# Patient Record
Sex: Female | Born: 1970
Health system: Southern US, Community
[De-identification: ages and names within clinical notes are randomized; demographics above are authoritative.]

## PROBLEM LIST (undated history)

## (undated) DIAGNOSIS — F329 Major depressive disorder, single episode, unspecified: Secondary | ICD-10-CM

## (undated) DIAGNOSIS — F909 Attention-deficit hyperactivity disorder, unspecified type: Secondary | ICD-10-CM

## (undated) DIAGNOSIS — J302 Other seasonal allergic rhinitis: Secondary | ICD-10-CM

## (undated) DIAGNOSIS — F419 Anxiety disorder, unspecified: Secondary | ICD-10-CM

## (undated) DIAGNOSIS — E119 Type 2 diabetes mellitus without complications: Secondary | ICD-10-CM

## (undated) DIAGNOSIS — F32A Depression, unspecified: Secondary | ICD-10-CM

## (undated) DIAGNOSIS — D649 Anemia, unspecified: Secondary | ICD-10-CM

## (undated) HISTORY — DX: Type 2 diabetes mellitus without complications: E11.9

## (undated) HISTORY — PX: TUBAL LIGATION: SHX77

## (undated) HISTORY — PX: WISDOM TOOTH EXTRACTION: SHX21

---

## 1999-12-08 ENCOUNTER — Other Ambulatory Visit: Admission: RE | Admit: 1999-12-08 | Discharge: 1999-12-08 | Payer: Self-pay | Admitting: Family Medicine

## 2000-01-24 ENCOUNTER — Other Ambulatory Visit: Admission: RE | Admit: 2000-01-24 | Discharge: 2000-01-24 | Payer: Self-pay | Admitting: Obstetrics and Gynecology

## 2000-01-24 ENCOUNTER — Encounter (INDEPENDENT_AMBULATORY_CARE_PROVIDER_SITE_OTHER): Payer: Self-pay

## 2000-08-22 ENCOUNTER — Other Ambulatory Visit: Admission: RE | Admit: 2000-08-22 | Discharge: 2000-08-22 | Payer: Self-pay | Admitting: Obstetrics and Gynecology

## 2000-09-02 ENCOUNTER — Inpatient Hospital Stay (HOSPITAL_COMMUNITY): Admission: AD | Admit: 2000-09-02 | Discharge: 2000-09-02 | Payer: Self-pay | Admitting: Obstetrics and Gynecology

## 2001-03-16 ENCOUNTER — Encounter: Payer: Self-pay | Admitting: Emergency Medicine

## 2001-03-16 ENCOUNTER — Emergency Department (HOSPITAL_COMMUNITY): Admission: EM | Admit: 2001-03-16 | Discharge: 2001-03-16 | Payer: Self-pay | Admitting: Emergency Medicine

## 2001-03-17 ENCOUNTER — Ambulatory Visit (HOSPITAL_COMMUNITY): Admission: RE | Admit: 2001-03-17 | Discharge: 2001-03-17 | Payer: Self-pay | Admitting: Obstetrics and Gynecology

## 2001-04-22 ENCOUNTER — Inpatient Hospital Stay (HOSPITAL_COMMUNITY): Admission: AD | Admit: 2001-04-22 | Discharge: 2001-04-22 | Payer: Self-pay | Admitting: Obstetrics and Gynecology

## 2001-04-26 ENCOUNTER — Inpatient Hospital Stay (HOSPITAL_COMMUNITY): Admission: AD | Admit: 2001-04-26 | Discharge: 2001-04-26 | Payer: Self-pay | Admitting: Obstetrics and Gynecology

## 2001-04-30 ENCOUNTER — Ambulatory Visit (HOSPITAL_COMMUNITY): Admission: RE | Admit: 2001-04-30 | Discharge: 2001-04-30 | Payer: Self-pay | Admitting: Obstetrics and Gynecology

## 2001-04-30 ENCOUNTER — Encounter: Payer: Self-pay | Admitting: Obstetrics and Gynecology

## 2001-05-05 ENCOUNTER — Inpatient Hospital Stay (HOSPITAL_COMMUNITY): Admission: AD | Admit: 2001-05-05 | Discharge: 2001-05-07 | Payer: Self-pay | Admitting: Obstetrics and Gynecology

## 2001-06-13 ENCOUNTER — Other Ambulatory Visit: Admission: RE | Admit: 2001-06-13 | Discharge: 2001-06-13 | Payer: Self-pay | Admitting: Obstetrics and Gynecology

## 2002-04-28 ENCOUNTER — Inpatient Hospital Stay (HOSPITAL_COMMUNITY): Admission: AD | Admit: 2002-04-28 | Discharge: 2002-04-30 | Payer: Self-pay | Admitting: Obstetrics and Gynecology

## 2002-04-28 ENCOUNTER — Encounter (INDEPENDENT_AMBULATORY_CARE_PROVIDER_SITE_OTHER): Payer: Self-pay

## 2002-06-08 ENCOUNTER — Other Ambulatory Visit: Admission: RE | Admit: 2002-06-08 | Discharge: 2002-06-08 | Payer: Self-pay | Admitting: Obstetrics and Gynecology

## 2002-08-11 ENCOUNTER — Other Ambulatory Visit: Admission: RE | Admit: 2002-08-11 | Discharge: 2002-08-11 | Payer: Self-pay | Admitting: Obstetrics and Gynecology

## 2003-03-14 ENCOUNTER — Encounter: Payer: Self-pay | Admitting: Emergency Medicine

## 2003-03-14 ENCOUNTER — Inpatient Hospital Stay (HOSPITAL_COMMUNITY): Admission: AD | Admit: 2003-03-14 | Discharge: 2003-03-17 | Payer: Self-pay | Admitting: Emergency Medicine

## 2003-03-15 ENCOUNTER — Encounter: Payer: Self-pay | Admitting: General Surgery

## 2003-03-15 ENCOUNTER — Encounter (INDEPENDENT_AMBULATORY_CARE_PROVIDER_SITE_OTHER): Payer: Self-pay | Admitting: Specialist

## 2003-12-03 ENCOUNTER — Emergency Department (HOSPITAL_COMMUNITY): Admission: EM | Admit: 2003-12-03 | Discharge: 2003-12-03 | Payer: Self-pay | Admitting: Emergency Medicine

## 2003-12-03 ENCOUNTER — Emergency Department (HOSPITAL_COMMUNITY): Admission: EM | Admit: 2003-12-03 | Discharge: 2003-12-03 | Payer: Self-pay

## 2004-03-21 ENCOUNTER — Other Ambulatory Visit: Admission: RE | Admit: 2004-03-21 | Discharge: 2004-03-21 | Payer: Self-pay | Admitting: Obstetrics and Gynecology

## 2004-09-15 ENCOUNTER — Emergency Department (HOSPITAL_COMMUNITY): Admission: EM | Admit: 2004-09-15 | Discharge: 2004-09-16 | Payer: Self-pay | Admitting: Emergency Medicine

## 2005-02-05 ENCOUNTER — Ambulatory Visit: Payer: Self-pay | Admitting: Internal Medicine

## 2005-03-02 ENCOUNTER — Ambulatory Visit: Payer: Self-pay | Admitting: Internal Medicine

## 2005-03-07 ENCOUNTER — Emergency Department (HOSPITAL_COMMUNITY): Admission: EM | Admit: 2005-03-07 | Discharge: 2005-03-07 | Payer: Self-pay | Admitting: Emergency Medicine

## 2005-05-07 ENCOUNTER — Ambulatory Visit: Payer: Self-pay | Admitting: Internal Medicine

## 2005-09-21 ENCOUNTER — Other Ambulatory Visit: Admission: RE | Admit: 2005-09-21 | Discharge: 2005-09-21 | Payer: Self-pay | Admitting: Obstetrics and Gynecology

## 2005-10-02 ENCOUNTER — Ambulatory Visit: Payer: Self-pay | Admitting: Family Medicine

## 2006-01-28 ENCOUNTER — Ambulatory Visit: Payer: Self-pay | Admitting: Family Medicine

## 2006-06-16 ENCOUNTER — Emergency Department (HOSPITAL_COMMUNITY): Admission: EM | Admit: 2006-06-16 | Discharge: 2006-06-16 | Payer: Self-pay | Admitting: Family Medicine

## 2006-06-25 ENCOUNTER — Emergency Department (HOSPITAL_COMMUNITY): Admission: EM | Admit: 2006-06-25 | Discharge: 2006-06-25 | Payer: Self-pay | Admitting: Emergency Medicine

## 2006-08-27 ENCOUNTER — Ambulatory Visit: Payer: Self-pay | Admitting: Family Medicine

## 2006-09-03 ENCOUNTER — Ambulatory Visit: Payer: Self-pay | Admitting: Family Medicine

## 2007-07-04 ENCOUNTER — Ambulatory Visit: Payer: Self-pay | Admitting: Family Medicine

## 2007-07-04 DIAGNOSIS — F17200 Nicotine dependence, unspecified, uncomplicated: Secondary | ICD-10-CM

## 2007-07-04 DIAGNOSIS — J320 Chronic maxillary sinusitis: Secondary | ICD-10-CM

## 2007-10-24 ENCOUNTER — Emergency Department (HOSPITAL_COMMUNITY): Admission: EM | Admit: 2007-10-24 | Discharge: 2007-10-24 | Payer: Self-pay | Admitting: Emergency Medicine

## 2008-01-02 ENCOUNTER — Telehealth: Payer: Self-pay | Admitting: Internal Medicine

## 2008-01-19 ENCOUNTER — Emergency Department (HOSPITAL_COMMUNITY): Admission: EM | Admit: 2008-01-19 | Discharge: 2008-01-19 | Payer: Self-pay | Admitting: Family Medicine

## 2008-08-26 ENCOUNTER — Telehealth: Payer: Self-pay | Admitting: Family Medicine

## 2008-12-11 ENCOUNTER — Emergency Department (HOSPITAL_COMMUNITY): Admission: EM | Admit: 2008-12-11 | Discharge: 2008-12-11 | Payer: Self-pay | Admitting: Family Medicine

## 2009-09-08 ENCOUNTER — Ambulatory Visit: Payer: Self-pay | Admitting: Family Medicine

## 2009-09-08 DIAGNOSIS — F331 Major depressive disorder, recurrent, moderate: Secondary | ICD-10-CM

## 2009-10-14 ENCOUNTER — Telehealth (INDEPENDENT_AMBULATORY_CARE_PROVIDER_SITE_OTHER): Payer: Self-pay | Admitting: Internal Medicine

## 2009-12-19 ENCOUNTER — Emergency Department (HOSPITAL_COMMUNITY): Admission: EM | Admit: 2009-12-19 | Discharge: 2009-12-19 | Payer: Self-pay | Admitting: Internal Medicine

## 2009-12-30 ENCOUNTER — Telehealth: Payer: Self-pay | Admitting: Family Medicine

## 2010-01-04 ENCOUNTER — Ambulatory Visit: Payer: Self-pay | Admitting: Family Medicine

## 2010-01-04 DIAGNOSIS — F411 Generalized anxiety disorder: Secondary | ICD-10-CM | POA: Insufficient documentation

## 2010-01-12 ENCOUNTER — Ambulatory Visit: Payer: Self-pay | Admitting: Professional

## 2010-12-28 NOTE — Assessment & Plan Note (Signed)
Summary: REFILL MED/BILLIE'S PT/CLE   Vital Signs:  Patient profile:   39 year old female Height:      65 inches Weight:      232.50 pounds BMI:     38.83 Temp:     98.2 degrees F oral Pulse rate:   120 / minute Pulse rhythm:   regular BP sitting:   122 / 88  (left arm) Cuff size:   large  Vitals Entered By: Linde Gillis CMA Duncan Dull) (January 04, 2010 9:51 AM) CC: medication refill, Billie patient   History of Present Illness: Depression..started on Wellbutrin 3 months ago. Has been having increas in stress in last 2 weeks. Daughter ran away.  Increase inanxiety in last 2 weeks.  Sad, angry, anxious. Difficulty with insomnia in last few weeks.       Problems Prior to Update: 1)  Depression  (ICD-311) 2)  Tobacco Abuse  (ICD-305.1) 3)  Maxillary Sinusitis  (ICD-473.0)  Current Medications (verified): 1)  Claritin 10 Mg  Tabs (Loratadine) .... Take 1 Tablet By Mouth Once A Day As Needed 2)  Adderall 30 Mg  Tabs (Amphetamine-Dextroamphetamine) .... Take 1 Tablet By Mouth Once A Day 3)  Bupropion Hcl 150 Mg Xr24h-Tab (Bupropion Hcl) .Marland Kitchen.. 1 Tab By Mouth Daily  Allergies (verified): No Known Drug Allergies  Past History:  Past medical, surgical, family and social histories (including risk factors) reviewed, and no changes noted (except as noted below).  Family History: Reviewed history and no changes required.  Social History: Reviewed history from 09/08/2009 and no changes required. Marital Status: Married Children: 4 Occupation: works at [employer] lab  Review of Systems General:  Complains of fatigue. CV:  Denies chest pain or discomfort. Resp:  Denies shortness of breath. Psych:  Complains of suicidal thoughts/plans; no plans of suicide.Marland KitchenMarland Kitchen"If I messed my daughter up..I might mess my other kids up..would they be better off without me"..but she states she would not do it because then they would be alone. Marland Kitchen  Physical Exam  General:  alert, well-developed,  well-nourished, well-hydrated, and overweight-appearing.  has gained 23 lbs since last visit 06/2007 Mouth:  MMM Neck:  no carotid bruit or thyromegaly no cervical or supraclavicular lymphadenopathy  Lungs:  Normal respiratory effort, chest expands symmetrically. Lungs are clear to auscultation, no crackles or wheezes. Heart:  Normal rate and regular rhythm. S1 and S2 normal without gallop, murmur, click, rub or other extra sounds.   Impression & Recommendations:  Problem # 1:  DEPRESSION (ICD-311) Poor control..Change to long acting wellbutrin..may need to also increase further. Call if sleep continues to be issue to consider trazodone. Strongly recommended family counsleing. Gaynelle Adu will be given info to arrange.  Close folloow up in 36month.  Her updated medication list for this problem includes:    Bupropion Hcl 150 Mg Xr24h-tab (Bupropion hcl) .Marland Kitchen... 1 tab by mouth daily  Orders: Psychology Referral (Psychology)  Problem # 2:  ANXIETY STATE, UNSPECIFIED (ICD-300.00)  Her updated medication list for this problem includes:    Bupropion Hcl 150 Mg Xr24h-tab (Bupropion hcl) .Marland Kitchen... 1 tab by mouth daily  Orders: Psychology Referral (Psychology)  Complete Medication List: 1)  Claritin 10 Mg Tabs (Loratadine) .... Take 1 tablet by mouth once a day as needed 2)  Adderall 30 Mg Tabs (Amphetamine-dextroamphetamine) .... Take 1 tablet by mouth once a day 3)  Bupropion Hcl 150 Mg Xr24h-tab (Bupropion hcl) .Marland Kitchen.. 1 tab by mouth daily  Patient Instructions: 1)  Referral Appointment Information 2)  Day/Date:  3)  Time: 4)  Place/MD: 5)  Address: 6)  Phone/Fax: 7)  Patient given appointment information. Information/Orders faxed/mailed.  8)  Please schedule a follow-up appointment in 1 month depression. 9)  Schedule CPX in next few months.  Prescriptions: BUPROPION HCL 150 MG XR24H-TAB (BUPROPION HCL) 1 tab by mouth daily  #90 x 0   Entered and Authorized by:   Kerby Nora MD   Signed by:   Kerby Nora MD on 01/04/2010   Method used:   Electronically to        [facility]* (retail)       [address]       [address]mail[address]       [place], [place]  [address]       Ph: [phone]       Fax: [phone]   RxID:   [id number]   Current Allergies (reviewed today): No known allergies

## 2010-12-28 NOTE — Progress Notes (Signed)
Summary: wellbutrin   Phone Note Refill Request Call back at [phone] Message from:  Patient on December 30, 2009 8:58 AM  Refills Requested: Medication #1:  WELLBUTRIN SR 150 MG XR12H-TAB take 1 each morning. [facility]   Initial call taken by: Melody Comas,  December 30, 2009 8:59 AM  Follow-up for Phone Call        px written on EMR for call in  Follow-up by: Judith Part MD,  December 30, 2009 9:15 AM    Prescriptions: WELLBUTRIN SR 150 MG XR12H-TAB (BUPROPION HCL) take 1 each morning  #30 x 11   Entered by:   Lowella Petties CMA   Authorized by:   Judith Part MD   Signed by:   Lowella Petties CMA on 12/30/2009   Method used:   Electronically to        [facility]* (retail)       [address]       [address] [place] [address]       [place], [place]  [address]       Ph: [phone]       Fax: [phone]   RxID:   [id number] WELLBUTRIN SR 150 MG XR12H-TAB (BUPROPION HCL) take 1 each morning  #30 x 11   Entered and Authorized by:   Judith Part MD   Signed by:   Judith Part MD on 12/30/2009   Method used:   Telephoned to ...         RxID:   [id number]

## 2011-03-27 ENCOUNTER — Ambulatory Visit (INDEPENDENT_AMBULATORY_CARE_PROVIDER_SITE_OTHER): Payer: 59 | Admitting: Family Medicine

## 2011-03-27 ENCOUNTER — Encounter: Payer: Self-pay | Admitting: Family Medicine

## 2011-03-27 VITALS — BP 100/60 | HR 70 | Temp 98.0°F | Ht 65.0 in | Wt 240.1 lb

## 2011-03-27 DIAGNOSIS — J329 Chronic sinusitis, unspecified: Secondary | ICD-10-CM

## 2011-03-27 HISTORY — PX: CHOLECYSTECTOMY: SHX55

## 2011-03-27 MED ORDER — AZITHROMYCIN 250 MG PO TABS: 250.0000 mg | ORAL_TABLET | Freq: Every day | ORAL | Status: DC

## 2011-03-27 MED ORDER — ALBUTEROL SULFATE HFA 108 (90 BASE) MCG/ACT IN AERS: 2.0000 | INHALATION_SPRAY | Freq: Four times a day (QID) | RESPIRATORY_TRACT | Status: DC | PRN

## 2011-03-27 MED ORDER — AZITHROMYCIN 250 MG PO TABS: ORAL_TABLET | ORAL | Status: DC

## 2011-03-27 NOTE — Progress Notes (Signed)
Addended by: Linde Gillis on: 03/27/2011 10:59 AM   Modules accepted: Orders

## 2011-03-27 NOTE — Progress Notes (Signed)
Subjective:     Patricia Bender is a 40 y.o. female who presents for evaluation of sinus pain. Symptoms include: congestion, cough, facial pain, fevers, nasal congestion, sore throat and dry cough, wheezing. Onset of symptoms was 5 days ago. Symptoms have been gradually worsening since that time. Past history is significant for asthma. Patient is a smoker.  The PMH, PSH, Social History, Family History, Medications, and allergies have been reviewed in [facility], and have been updated if relevant.   Review of Systems Pertinent items are noted in HPI.   Objective:    General appearance: alert and fatigued  HEENT:  TMs retracted bilaterally, no lymphadenopathy Resp:  Diffuse exp wheezes, no cracks. CVS:  RRR   Assessment:    Acute bacterial sinusitis.  Acute bronchitis.   Plan:    Nasal saline sprays. Antihistamines per medication orders. Azithromycin per medication orders.

## 2011-04-13 NOTE — Consult Note (Signed)
NAME:  Patricia Bender, Patricia Bender                        ACCOUNT NO.:  [id number]   MEDICAL RECORD NO.:  [id number]                   PATIENT TYPE:  INP   LOCATION:  5740                                 FACILITY:  [facility]   PHYSICIAN:  Petra Kuba, M.D.                 DATE OF BIRTH:  Jun 29, 1971   DATE OF CONSULTATION:  03/15/2003  DATE OF DISCHARGE:                                   CONSULTATION   HISTORY:  The patient seen at the request of Dr. Magnus Ivan for abdominal pain  and ultrasound implying CBD stones.  Her liver tests are normal.  Prior to  Friday she has had no GI problems but had some increasing pain and when the  pain got worse came to the emergency room.  Labs were normal except for an  increased white count but her ultrasound had both gallstones and possible  CBD stone, and I was consulted for further workup and plans.   PAST MEDICAL HISTORY:  Negative except for tubal ligation and sinus  problems.   FAMILY HISTORY:  Pertinent for a mother with ulcers and gallbladder  problems.   SOCIAL HISTORY:  Does not drink but does smoke.   PERTINENT MEDICATIONS:  Sudafed only.  She does not take any aspirin or  nonsteroidals.   REVIEW OF SYSTEMS:  Negative except as above.   ALLERGIES:  No known allergies.   PHYSICAL EXAMINATION:  VITAL SIGNS:  See chart.  GENERAL:  The patient not examined today; will be prior to ERCP if needed.  Exam improved today per Dr. Magnus Ivan.  Labs reviewed.   ASSESSMENT:  Gallstones and questionable CBD stones.   PLAN:  If her LFTs are significantly increased today, would proceed with an  ERCP first.  If not, I have discussed the patient with Dr. Magnus Ivan and if  her liver tests are still normal would proceed with a laparoscopic  cholecystectomy and intraoperative cholangiogram.  I have discussed the  risks, benefits, methods, and options of laparoscopic cholecystectomy versus  open cholecystectomy versus ERCP including success rate and risks of  ERCP  and the way that we remove stones; now we only like to proceed if we have a  good suspicion that they are there by  either positive intraoperative cholangiogram or elevated liver tests, and  without them possibly the ultrasound history of showing stones in the cystic  duct.  Unfortunately, the liver tests are still pending at the time of this  dictation.  Will be on standby to check them and proceed either later today  or if intraoperative cholangiogram positive, tomorrow.                                               Petra Kuba, M.D.    MEM/MEDQ  D:  03/15/2003  T:  03/15/2003  Job:  [id number]   cc:   Abigail Miyamoto, M.D.  [address]  [place]  [place] [address]  Fax: [phone]

## 2011-04-13 NOTE — H&P (Signed)
NAME:  Patricia Bender, Patricia Bender                        ACCOUNT NO.:  [id number]   MEDICAL RECORD NO.:  [id number]                   PATIENT TYPE:  EMS   LOCATION:  MAJO                                 FACILITY:  [facility]   PHYSICIAN:  Abigail Miyamoto, M.D.              DATE OF BIRTH:  15-Apr-1971   DATE OF ADMISSION:  03/14/2003  DATE OF DISCHARGE:                                HISTORY & PHYSICAL   CHIEF COMPLAINT:  Abdominal pain.   HISTORY OF PRESENT ILLNESS:  This is a 40 year old female who developed  right upper quadrant abdominal pain and chest pain approximately three days  ago.  She describes the pain as gripping and cramping.  She has had some  nausea and emesis x1 yesterday.  She reports she is moving her bowels well.  She has had no jaundice or hematochezia.  She has had no previous history of  abdominal pain.  She says the pain is quite severe.   PAST MEDICAL HISTORY:  Negative.   PAST SURGICAL HISTORY:  Tubal ligation.   MEDICATIONS:  None.   ALLERGIES:  No known drug allergies.   SOCIAL HISTORY:  She is a Diplomatic Services operational officer for [employer].  She does smoke a pack  of cigarettes per day.  She does not drink alcohol.   REVIEW OF SYMPTOMS:  CARDIOPULMONARY:  She has no cardiopulmonary symptoms.  GENITOURINARY:  She has no urinary symptoms and she is not pregnant.   PHYSICAL EXAMINATION:  GENERAL:  An obese female in moderate discomfort.  VITAL SIGNS:  Temperature 98.3, heart rate 60, respirations 14, blood  pressure 111/67.  HEENT:  She is anicteric.  Her oropharynx is clear.  NECK:  Supple.  LUNGS:  Clear to auscultation bilaterally.  CARDIAC:  Regular rate and rhythm.  ABDOMEN:  Soft with moderate to severe tenderness with guarding in the right  upper quadrant.  There is no organomegaly.  There are no masses or hernias.  She has a well-healed incision at the umbilicus.  EXTREMITIES:  Warm and well-perfused.   LABORATORY DATA AND X-RAY FINDINGS:  The patient has an elevated  white blood  count at 14.5, hemoglobin 15.2, hematocrit 45.3 and platelets 255.  Liver  function tests are normal with a bilirubin of 0.8, AST of 31, ALT 39, Alk  phos 75 and lipase 19.  Urine pregnancy test is negative.   The patient has an ultrasound of the abdomen which shows her to have a  dilated common bile duct and possible multiple common bile duct stones.  She  also has a gallbladder with thickened wall and stones retained within the  gallbladder.    ASSESSMENT/PLAN:  This is a 40 year old female with probable acute  cholecystitis and possible choledocholithiasis.  This is surprising given  her normal liver function test.  At this point, gastroenterology will be  consulted for possible preoperative endoscopic retrograde  cholangiopancreatography.  She will be admitted for  bowel rest, intravenous  hydration and intravenous antibiotics with planned laparoscopic  cholecystectomy as well.                                               Abigail Miyamoto, M.D.    DB/MEDQ  D:  03/14/2003  T:  03/15/2003  Job:  [id number]

## 2011-04-13 NOTE — Discharge Summary (Signed)
   NAME:  Patricia Bender, Patricia Bender                        ACCOUNT NO.:  [id number]   MEDICAL RECORD NO.:  [id number]                   PATIENT TYPE:  INP   LOCATION:  5740                                 FACILITY:  [facility]   PHYSICIAN:  Sharlet Salina T. Hoxworth, M.D.          DATE OF BIRTH:  Jan 11, 1971   DATE OF ADMISSION:  03/14/2003  DATE OF DISCHARGE:  03/17/2003                                 DISCHARGE SUMMARY   DISCHARGE DIAGNOSIS:  Severe acute and subacute cholecystitis and  cholelithiasis.   PROCEDURE:  Laparoscopic cholecystectomy with intraoperative cholangiogram  March 15, 2003.   HISTORY OF PRESENT ILLNESS:  This patient is a 40 year old white female who  developed right upper quadrant and low chest pain three days prior to  admission that has been persistent and she presented for evaluation.  She  denies previous history of abdominal pain.  The pain is severe.   PAST SURGICAL HISTORY:  Significant only for tubal ligation.   MEDICATIONS:  None.   ALLERGIES:  None.   Social history, family history, review of systems see detailed H&P.   PHYSICAL EXAMINATION:  ABDOMEN:  Soft with moderate tenderness and guarding  in the right upper quadrant.   LABORATORY DATA:  White count 14.5.  LFTs normal.   Ultrasound of the abdomen shows a mildly dilated common bile duct with  possible stones and thickened gallbladder wall with stones.   HOSPITAL COURSE:  Problem #1.  The patient was admitted, treated initially  with pain medicine and IV antibiotics and hydration and was taken to the  operating room on April 19.  Findings were:  Severe acute cholecystitis and  subacute cholecystitis.   Problem #2.  Cholangiogram was normal.   Problem #3.  She was treated perioperatively and postoperatively with broad  spectrum antibiotics.   Problem #4.  On the first postoperative day, she had some fever to 101.8.  She was up in a chair and felt better.  Antibiotics were continued.  On  April 21, she  continued to feel better.  White count was decreased to 9,000.  She had minimal JP drainage without bile and this was removed.  She was  discharged home on March 17, 2003.    DISCHARGE MEDICATIONS:  Vicodin as needed for pain.   FOLLOWUP:  Followup is to be in my office in 10-14 days.                                               Lorne Skeens. Hoxworth, M.D.    Tory Emerald  D:  04/16/2003  T:  04/16/2003  Job:  [id number]

## 2011-04-13 NOTE — Op Note (Signed)
NAME:  Patricia Bender, Patricia Bender                        ACCOUNT NO.:  [id number]   MEDICAL RECORD NO.:  [id number]                   PATIENT TYPE:  INP   LOCATION:  5740                                 FACILITY:  [facility]   PHYSICIAN:  Sharlet Salina T. Hoxworth, M.D.          DATE OF BIRTH:  [birth year]   DATE OF PROCEDURE:  03/15/2003  DATE OF DISCHARGE:                                 OPERATIVE REPORT   PREOPERATIVE DIAGNOSIS:  Cholelithiasis and cholecystitis.   POSTOPERATIVE DIAGNOSIS:  Cholelithiasis and cholecystitis.   OPERATION PERFORMED:  Laparoscopic cholecystectomy with intraoperative  cholangiogram.   SURGEON:  Sharlet Salina T. Hoxworth, M.D.   ANESTHESIA:  General.   INDICATIONS FOR PROCEDURE:  The patient is a 40 year old female who presents  with a three-day history of right upper quadrant abdominal pain and more  recent nausea and vomiting.  She has had a gallbladder ultrasound showing  thickened gallbladder wall and stones.  She had elevated white count and  mildly elevated liver function tests.  She is felt to have acute  cholecystitis and laparoscopic cholecystectomy with intraoperative  cholangiogram has been recommended and accepted.  The nature of the  procedure, its indications and risks of bleeding, infection, bile leak and  bile duct injury were discussed and understood.  She is now brought to the  operating room for this procedure.   DESCRIPTION OF PROCEDURE:  The patient was brought to the operating room and  placed in the supine position on the operating table and general  endotracheal anesthesia was induced.  She received preoperative intravenous  antibiotics.  The abdomen was sterilely prepped and draped.  Local  anesthesia was used to infiltrate the trocar sites prior to incision.  A 1  cm incision was made at the umbilicus and dissection carried down to the  midline fascia which was sharply incised for 1 cm.  The peritoneum entered  under direct vision.  Through a  mattress suture of 0 Vicryl, a Hasson trocar  was placed and pneumoperitoneum established.  Under direct vision, a 10 mm  trocar was placed in the subxiphoid area and two 5 mm trocars on the right  subcostal margin.  The gallbladder was visualized and had marked acute and  subacute inflammation.  Omental inflammatory adhesions were taken down off  the fundus and body of the gallbladder and the fundus was aspirated with  needle aspirator and then was able to be grasped and elevated up over the  liver.  The infundibulum was very thick and edematous.  It was able to be  grasped and the peritoneum along the anterior and posterior distal  gallbladder was incised and fibrofatty tissue stripped down off the wall of  the gallbladder toward the porta hepatis.  Dissection was tedious but  progressed adequately and careful dissection of the distal gallbladder  revealed  it tapered down to the cystic duct and the cystic duct,  gallbladder junction was dissected 360 degrees  and the cystic duct cleared  over about a centimeter.  When the anatomy  appeared clear, the cystic duct  was clipped at the gallbladder junction and operative cholangiograms were  obtained through the cystic duct.  This showed good filling of a normal  sized common bile duct and intrahepatic ducts with free flow into the  duodenum and no filling defects.  Following this, the cholangiocath was  removed and the cystic duct doubly clipped proximally and divided.  staying  right on the gallbladder wall, it was dissected out of the liver bed.  There  was severe acute and subacute inflammation.  I was able to however, stay  right on the gallbladder wall and I did not encounter any identifiable  cystic artery.  As dissection progressed, the back wall of the gallbladder  was essentially fused or eroded and the gallbladder was entered and multiple  stones were removed.  A large stone removed with EndoCatch bag.  The  gallbladder was then  further dissected away from the gallbladder bed and  removed and placed in an EndoCatch bag.  The right upper quadrant was  thoroughly irrigated and suctioned, hemostasis obtained.  Closed suction  drain was left in Morrison's pouch and brought out through one of the  lateral trocar sites.  Trocar was removed under direct vision.  All CO2  evacuated from the peritoneal cavity.  The mattress suture was secured at  the umbilicus.  Skin incisions were closed with interrupted subcuticular 4-0  Monocryl and Steri-Strips.  Sponge, needle and instrument counts were  correct.  Dry sterile dressings were applied.  The patient was taken to the  recovery room in satisfactory condition.                                                Lorne Skeens. Hoxworth, M.D.    Tory Emerald  D:  03/15/2003  T:  03/16/2003  Job:  [id number]

## 2011-04-13 NOTE — Op Note (Signed)
[facility]  Patient:    Patricia Bender, Patricia Bender Visit Number: [id number] MRN: [id number]          Service Type: OBS Location: 910A [id number] Attending Physician:  Soledad Gerlach Dictated by:   Guy Sandifer Arleta Creek, M.D. Proc. Date: 04/29/02 Admit Date:  04/28/2002 Discharge Date: 04/30/2002                             Operative Report  PREOPERATIVE DIAGNOSES:       Desires permanent sterilization.  POSTOPERATIVE DIAGNOSES:      Desires permanent sterilization.  PROCEDURE:                    Postpartum bilateral tubal ligation.  SURGEON:                      Guy Sandifer. Arleta Creek, M.D.  ANESTHESIA:                   Failed epidural, general with endotracheal intubation by Cherre Blanc, M.D.  ESTIMATED BLOOD LOSS:         Drops.  INDICATIONS AND CONSENT:      This patient is a 40 year old married white female G5, P4 status post vaginal delivery of a healthy female child yesterday afternoon.  She desires permanent sterilization.  Alternate methods have been discussed.  Risks of surgery have been discussed including, but not limited to, infection, bowel, bladder, ureteral damage, bleeding requiring transfusion of blood products with possible transfusion reaction, HIV and hepatitis acquisition, DVT, PE, pneumonia, permanence of the procedure, failure rate of the procedure, increased ectopic risk.  All questions are answered and consent is signed on the chart.  PROCEDURE:  Patient is taken to the operating room.  Her epidural catheter is still in place.  Epidural anesthetic is then administered.  Patient begins to get a level and begins to show signs of numbness.  The patient is prepped and draped.  However, after waiting an adequate amount of time, it is clear the epidural is not working well.  The decision is made to proceed with general anesthetic.  She then has general anesthesia induced via endotracheal intubation.  The skin is entered  through an infraumbilical incision. Dissection is carried out in layers to the peritoneum which is bluntly perforated.  The right fallopian tube is identified from cornu to fimbria, grasped in its mid ampullary portion with a Babcock clamp and the intervening knuckle of tube is then doubly ligated with two free ties of 0 plain suture. The intervening knuckle of tube is then resected.  Cautery is used to obtain complete hemostasis.  Marcaine 0.5% plain is then dripped onto the fallopian tube.  A similar procedure is carried out on the left side as well.  The fascia is closed with 0 Vicryl sutures at each angle which had been placed while entering the abdomen.  This closes the fascial defect.  Vicryl 4-0 is then used in a single suture in the subcutaneous layers to close the dead space and then is used in a subcuticular fashion to close the skin incision. The incision is injected with 0.5% plain Marcaine.  Dressings are applied. All counts are correct.  Patient is awakened and taken to recovery room in stable condition. Dictated by:   Guy Sandifer Arleta Creek, M.D. Attending Physician:  Soledad Gerlach DD:  04/29/02 TD:  05/01/02  Job: [id number]

## 2011-06-15 ENCOUNTER — Encounter: Payer: Self-pay | Admitting: Family Medicine

## 2011-06-15 ENCOUNTER — Ambulatory Visit (INDEPENDENT_AMBULATORY_CARE_PROVIDER_SITE_OTHER): Payer: 59 | Admitting: Family Medicine

## 2011-06-15 VITALS — BP 120/86 | HR 66 | Temp 98.3°F | Ht 65.0 in | Wt 244.8 lb

## 2011-06-15 DIAGNOSIS — J40 Bronchitis, not specified as acute or chronic: Secondary | ICD-10-CM | POA: Insufficient documentation

## 2011-06-15 DIAGNOSIS — R609 Edema, unspecified: Secondary | ICD-10-CM | POA: Insufficient documentation

## 2011-06-15 DIAGNOSIS — J320 Chronic maxillary sinusitis: Secondary | ICD-10-CM

## 2011-06-15 DIAGNOSIS — F17200 Nicotine dependence, unspecified, uncomplicated: Secondary | ICD-10-CM

## 2011-06-15 MED ORDER — FLUTICASONE PROPIONATE 50 MCG/ACT NA SUSP: 2.0000 | Freq: Every day | NASAL | Status: DC

## 2011-06-15 MED ORDER — AZITHROMYCIN 250 MG PO TABS: 250.0000 mg | ORAL_TABLET | Freq: Every day | ORAL | Status: DC

## 2011-06-15 MED ORDER — ALBUTEROL SULFATE HFA 108 (90 BASE) MCG/ACT IN AERS: 2.0000 | INHALATION_SPRAY | Freq: Four times a day (QID) | RESPIRATORY_TRACT | Status: DC | PRN

## 2011-06-15 NOTE — Progress Notes (Signed)
  Subjective:    Patient ID: Patricia Bender, female    DOB: 03/19/1971, 40 y.o.   MRN: [id number]  HPI  40 yo here for multiple complaints, including HA, edema and wheezing.  HA and wheezing- Past few months, frontal pressure when she coughs. Constantly congested.  Claritin D Typically helps, has not been helping with congestion. Feels like she is wheezing.  She is a smoker. NO CP or SOB. Afebrile but has chills. No photophobia or focal neurological issues. Sometimes nauseated but not associated with headache. No abdominal pain.  Had some rectal bleeding when she wiped but that has resolved-has issues with constipation.  Edema- Hands feel like they are swollen- rings are tight.  Admits to gaining weight. Wt Readings from Last 3 Encounters:  06/15/11 244 lb 12.8 oz (111.041 kg)  03/27/11 240 lb 1.9 oz (108.918 kg)  01/04/10 232 lb 8 oz (105.461 kg)  Legs not swollen.   The PMH, PSH, Social History, Family History, Medications, and allergies have been reviewed in [facility], and have been updated if relevant.    Review of Systems See HPI    Objective:   Physical Exam BP 120/86  Pulse 66  Temp(Src) 98.3 F (36.8 C) (Oral)  Ht 5\' 5"  (1.651 m)  Wt 244 lb 12.8 oz (111.041 kg)  BMI 40.74 kg/m2  SpO2 97% Gen: alert, overweight, no increased WOB HEENT:  +boggy, erythematous turbinates, sinuses TTP throughtout +PND Resp:  +scattered exp wheezes, no crackles, normal WOB CVS:  RRR Ext: no le edema, +fingers do seem swollen- rings are tight     Assessment & Plan:   1. Edema  No signs of cardiac cause. Will check labs to rule out other causes, likely due to heat and increased weight, high salt/high fat diet. Orders Placed This Encounter  Procedures  . CBC w/Diff  . TSH  . Basic Metabolic Panel (BMET)   Advised lower sodium diet. The patient indicates understanding of these issues and agrees with the plan.    2. MAXILLARY SINUSITIS  New. Given duration and  progression of symptoms and tobacco abuse, will treat for bacterial sinusitis and bronchitis..   3. Bronchitis   See above

## 2011-06-16 LAB — CBC WITH DIFFERENTIAL/PLATELET
Basophils Absolute: 0.1 10*3/uL (ref 0.0–0.1)
Basophils Relative: 1 % (ref 0–1)
HCT: 41.3 % (ref 36.0–46.0)
Lymphocytes Relative: 27 % (ref 12–46)
MCHC: 33.2 g/dL (ref 30.0–36.0)
Neutro Abs: 6.4 10*3/uL (ref 1.7–7.7)
Neutrophils Relative %: 60 % (ref 43–77)
Platelets: 278 10*3/uL (ref 150–400)
RDW: 13.7 % (ref 11.5–15.5)
WBC: 10.6 10*3/uL — ABNORMAL HIGH (ref 4.0–10.5)

## 2011-06-16 LAB — BASIC METABOLIC PANEL
Calcium: 9.6 mg/dL (ref 8.4–10.5)
Glucose, Bld: 129 mg/dL — ABNORMAL HIGH (ref 70–99)
Potassium: 4.2 mEq/L (ref 3.5–5.3)
Sodium: 141 mEq/L (ref 135–145)

## 2011-06-16 LAB — TSH: TSH: 3.51 u[IU]/mL (ref 0.350–4.500)

## 2012-08-19 ENCOUNTER — Other Ambulatory Visit: Payer: Self-pay | Admitting: Family Medicine

## 2012-12-02 ENCOUNTER — Encounter: Payer: Self-pay | Admitting: Family Medicine

## 2012-12-02 ENCOUNTER — Ambulatory Visit (INDEPENDENT_AMBULATORY_CARE_PROVIDER_SITE_OTHER): Payer: 59 | Admitting: Family Medicine

## 2012-12-02 VITALS — BP 120/74 | HR 90 | Temp 97.9°F | Ht 65.0 in | Wt 250.5 lb

## 2012-12-02 DIAGNOSIS — J40 Bronchitis, not specified as acute or chronic: Secondary | ICD-10-CM

## 2012-12-02 MED ORDER — FLUTICASONE PROPIONATE 50 MCG/ACT NA SUSP: 2.0000 | Freq: Every day | NASAL | Status: DC

## 2012-12-02 MED ORDER — AZITHROMYCIN 250 MG PO TABS: ORAL_TABLET | ORAL | Status: DC

## 2012-12-02 NOTE — Patient Instructions (Addendum)
Quit smoking. Start antibiotics. Call if not improving as expected in 72 hours. Go to ER if severe shortness of breath.

## 2012-12-02 NOTE — Assessment & Plan Note (Signed)
Given smoker > 2 weeks, recent new fever.. Will cover with antibiotics. No ongoing wheeze .  Stop smoking.  Follow up as neeed.

## 2012-12-02 NOTE — Progress Notes (Signed)
  Subjective:    Patient ID: Patricia Bender, female    DOB: 08-16-1971, 42 y.o.   MRN: [id number]  Cough This is a new problem. The current episode started 1 to 4 weeks ago (2 weeks). The problem has been gradually worsening. The problem occurs constantly. The cough is productive of sputum. Associated symptoms include chills and nasal congestion. Pertinent negatives include no ear congestion, hemoptysis or postnasal drip. Associated symptoms comments: Subjective fever.. 2 days ago. Nothing aggravates the symptoms. Risk factors for lung disease include smoking/tobacco exposure and occupational exposure. Treatments tried: mucinex DM, claritin, nyquil, dayquil. The treatment provided mild relief. Her past medical history is significant for environmental allergies. There is no history of asthma, bronchiectasis, COPD, emphysema or pneumonia.      Review of Systems  Constitutional: Positive for chills.  HENT: Negative for postnasal drip.   Respiratory: Negative for hemoptysis.   Hematological: Positive for environmental allergies.       Objective:   Physical Exam  Constitutional: Vital signs are normal. She appears well-developed and well-nourished. She is cooperative.  Non-toxic appearance. She does not appear ill. No distress.  HENT:  Head: Normocephalic.  Right Ear: Hearing, tympanic membrane, external ear and ear canal normal. Tympanic membrane is not erythematous, not retracted and not bulging.  Left Ear: Hearing, tympanic membrane, external ear and ear canal normal. Tympanic membrane is not erythematous, not retracted and not bulging.  Nose: Mucosal edema and rhinorrhea present. Right sinus exhibits no maxillary sinus tenderness and no frontal sinus tenderness. Left sinus exhibits no maxillary sinus tenderness and no frontal sinus tenderness.  Mouth/Throat: Uvula is midline, oropharynx is clear and moist and mucous membranes are normal.  Eyes: Conjunctivae normal, EOM and lids are normal.  Pupils are equal, round, and reactive to light. No foreign bodies found.  Neck: Trachea normal and normal range of motion. Neck supple. Carotid bruit is not present. No mass and no thyromegaly present.  Cardiovascular: Normal rate, regular rhythm, S1 normal, S2 normal, normal heart sounds, intact distal pulses and normal pulses.  Exam reveals no gallop and no friction rub.   No murmur heard. Pulmonary/Chest: Effort normal and breath sounds normal. Not tachypneic. No respiratory distress. She has no decreased breath sounds. She has no wheezes. She has no rhonchi. She has no rales.  Neurological: She is alert.  Skin: Skin is warm, dry and intact. No rash noted.  Psychiatric: Her speech is normal and behavior is normal. Judgment normal. Her mood appears not anxious. Cognition and memory are normal. She does not exhibit a depressed mood.          Assessment & Plan:

## 2013-04-13 ENCOUNTER — Ambulatory Visit: Payer: 59 | Admitting: Family Medicine

## 2013-04-13 ENCOUNTER — Ambulatory Visit (INDEPENDENT_AMBULATORY_CARE_PROVIDER_SITE_OTHER): Payer: 59 | Admitting: Family Medicine

## 2013-04-13 ENCOUNTER — Encounter: Payer: Self-pay | Admitting: Family Medicine

## 2013-04-13 VITALS — BP 120/60 | HR 89 | Temp 97.7°F | Ht 65.0 in | Wt 243.5 lb

## 2013-04-13 DIAGNOSIS — J45909 Unspecified asthma, uncomplicated: Secondary | ICD-10-CM

## 2013-04-13 DIAGNOSIS — J309 Allergic rhinitis, unspecified: Secondary | ICD-10-CM

## 2013-04-13 DIAGNOSIS — J683 Other acute and subacute respiratory conditions due to chemicals, gases, fumes and vapors: Secondary | ICD-10-CM

## 2013-04-13 MED ORDER — MONTELUKAST SODIUM 10 MG PO TABS: 10.0000 mg | ORAL_TABLET | Freq: Every day | ORAL | Status: DC

## 2013-04-13 MED ORDER — AZELASTINE-FLUTICASONE 137-50 MCG/ACT NA SUSP: 2.0000 | Freq: Two times a day (BID) | NASAL | Status: DC

## 2013-04-13 NOTE — Assessment & Plan Note (Signed)
Start Singulair and  Nasal steroid antihistamine combo.  Dizziness is likely inner ear due to Eustachian tube dysfunction.

## 2013-04-13 NOTE — Assessment & Plan Note (Signed)
Likely caused by smoking and allergies. At next OV... Perform spirometry to eval for COPD, athma.

## 2013-04-13 NOTE — Patient Instructions (Addendum)
Stop smoking. Start nasal steroid antihistamine combo.  Start singulair at bedtime.  Continue claritin.  Follow up allergies in 1 month. Call sooner if symtpoms worsening as opposed to improving... Will take some time .Marland Kitchen Few weeks or more to resolve.

## 2013-04-13 NOTE — Progress Notes (Signed)
  Subjective:    Patient ID: Patricia Bender, female    DOB: 1971/04/18, 42 y.o.   MRN: [id number]  HPI  42 year old female presents with  nasal congestion, post nasal drip for months. occ sneezing, no ithcy eyes.  In last 2 days she began noting muffled sound in right ear and dizziness.  No ear pain. No face pain, no sinus pain.  No fever, occ SOB, occ cough. Headache off and on in last few months.  Describes dizziness as room spinning sensation.. Worse with turning head. Causes nausea at times.   She has been using claritin and mucinex. She has tried zyrtec and allegra in past... But helped minimally. Not using flonase regularly.Marland Kitchen Helps minimally, does open nasal passages some for a while. She has even tried advair inpast... ahs not helped.  Never tried Singulair.  Smoking daily.    Review of Systems  Constitutional: Negative for fever and fatigue.  HENT: Negative for ear pain.   Eyes: Negative for pain.  Respiratory: Negative for chest tightness and shortness of breath.   Cardiovascular: Negative for chest pain, palpitations and leg swelling.  Gastrointestinal: Negative for abdominal pain.  Genitourinary: Negative for dysuria.       Objective:   Physical Exam  Constitutional: Vital signs are normal. She appears well-developed and well-nourished. She is cooperative.  Non-toxic appearance. She does not appear ill. No distress.  HENT:  Head: Normocephalic.  Right Ear: Hearing, external ear and ear canal normal. Tympanic membrane is not erythematous, not retracted and not bulging. A middle ear effusion is present.  Left Ear: Hearing, tympanic membrane, external ear and ear canal normal. Tympanic membrane is not erythematous, not retracted and not bulging.  No middle ear effusion.  Nose: Mucosal edema present. No rhinorrhea. Right sinus exhibits no maxillary sinus tenderness and no frontal sinus tenderness. Left sinus exhibits no maxillary sinus tenderness and no frontal sinus  tenderness.  Mouth/Throat: Uvula is midline, oropharynx is clear and moist and mucous membranes are normal. No oropharyngeal exudate, posterior oropharyngeal edema, posterior oropharyngeal erythema or tonsillar abscesses.  Eyes: Conjunctivae, EOM and lids are normal. Pupils are equal, round, and reactive to light. No foreign bodies found.  Neck: Trachea normal and normal range of motion. Neck supple. Carotid bruit is not present. No mass and no thyromegaly present.  Cardiovascular: Normal rate, regular rhythm, S1 normal, S2 normal, normal heart sounds, intact distal pulses and normal pulses.  Exam reveals no gallop and no friction rub.   No murmur heard. Pulmonary/Chest: Effort normal and breath sounds normal. Not tachypneic. No respiratory distress. She has no decreased breath sounds. She has no wheezes. She has no rhonchi. She has no rales.  Neurological: She is alert.  Skin: Skin is warm, dry and intact. No rash noted.  Psychiatric: Her speech is normal and behavior is normal. Judgment normal. Her mood appears not anxious. Cognition and memory are normal. She does not exhibit a depressed mood.          Assessment & Plan:

## 2013-05-14 ENCOUNTER — Encounter: Payer: Self-pay | Admitting: Family Medicine

## 2013-05-14 ENCOUNTER — Ambulatory Visit (INDEPENDENT_AMBULATORY_CARE_PROVIDER_SITE_OTHER): Payer: 59 | Admitting: Family Medicine

## 2013-05-14 VITALS — BP 108/70 | HR 92 | Temp 97.8°F | Wt 248.0 lb

## 2013-05-14 DIAGNOSIS — J309 Allergic rhinitis, unspecified: Secondary | ICD-10-CM

## 2013-05-14 DIAGNOSIS — J45909 Unspecified asthma, uncomplicated: Secondary | ICD-10-CM

## 2013-05-14 DIAGNOSIS — J683 Other acute and subacute respiratory conditions due to chemicals, gases, fumes and vapors: Secondary | ICD-10-CM

## 2013-05-14 NOTE — Patient Instructions (Addendum)
Make appt for complete physical exam in next 6 months if not performed elsewhere. Continue singulair and nasal spray, start each year 1 month before allergy season.

## 2013-05-14 NOTE — Assessment & Plan Note (Signed)
Dramatically improved with singulair and nasal steroid antihistamine prn. She will use both of these during allergy season each year.

## 2013-05-14 NOTE — Assessment & Plan Note (Addendum)
Secondary to allergies. Improved with singulair. Spirometry today appear normal after singulair x 1 month.

## 2013-05-14 NOTE — Progress Notes (Signed)
  Subjective:    Patient ID: Patricia Bender, female    DOB: 04/09/1971, 42 y.o.   MRN: [id number]  HPI 42 year old female presents for 1 month follow up on allergies and reactive airways.  She was started on singulair as well as nasal steroid antihistamine combo for eustacian tube dysfunction. Today she reports she has had an improvement in both breathing ans allergies. No SE to the medication.  She is not using nasal spray very often as it makes her nose sore. Fullness in ear and dizziness has resolved as well.        Review of Systems  Constitutional: Negative for fever and fatigue.  HENT: Negative for ear pain.   Eyes: Negative for pain.  Respiratory: Negative for chest tightness and shortness of breath.   Cardiovascular: Negative for chest pain, palpitations and leg swelling.  Gastrointestinal: Negative for abdominal pain.  Genitourinary: Negative for dysuria.       Objective:   Physical Exam  Constitutional: Vital signs are normal. She appears well-developed and well-nourished. She is cooperative.  Non-toxic appearance. She does not appear ill. No distress.  HENT:  Head: Normocephalic.  Right Ear: Hearing, tympanic membrane, external ear and ear canal normal. Tympanic membrane is not erythematous, not retracted and not bulging.  Left Ear: Hearing, tympanic membrane, external ear and ear canal normal. Tympanic membrane is not erythematous, not retracted and not bulging.  Nose: No mucosal edema or rhinorrhea. Right sinus exhibits no maxillary sinus tenderness and no frontal sinus tenderness. Left sinus exhibits no maxillary sinus tenderness and no frontal sinus tenderness.  Mouth/Throat: Uvula is midline, oropharynx is clear and moist and mucous membranes are normal.  Eyes: Conjunctivae, EOM and lids are normal. Pupils are equal, round, and reactive to light. No foreign bodies found.  Neck: Trachea normal and normal range of motion. Neck supple. Carotid bruit is not  present. No mass and no thyromegaly present.  Cardiovascular: Normal rate, regular rhythm, S1 normal, S2 normal, normal heart sounds, intact distal pulses and normal pulses.  Exam reveals no gallop and no friction rub.   No murmur heard. Pulmonary/Chest: Effort normal and breath sounds normal. Not tachypneic. No respiratory distress. She has no decreased breath sounds. She has no wheezes. She has no rhonchi. She has no rales.  Abdominal: Soft. Normal appearance and bowel sounds are normal. There is no tenderness.  Neurological: She is alert.  Skin: Skin is warm, dry and intact. No rash noted.  Psychiatric: Her speech is normal and behavior is normal. Judgment and thought content normal. Her mood appears not anxious. Cognition and memory are normal. She does not exhibit a depressed mood.          Assessment & Plan:

## 2013-07-24 ENCOUNTER — Ambulatory Visit (INDEPENDENT_AMBULATORY_CARE_PROVIDER_SITE_OTHER): Payer: 59 | Admitting: Family Medicine

## 2013-07-24 ENCOUNTER — Encounter: Payer: Self-pay | Admitting: Family Medicine

## 2013-07-24 VITALS — BP 112/80 | HR 93 | Temp 98.1°F | Wt 248.2 lb

## 2013-07-24 DIAGNOSIS — L989 Disorder of the skin and subcutaneous tissue, unspecified: Secondary | ICD-10-CM | POA: Insufficient documentation

## 2013-07-24 DIAGNOSIS — R21 Rash and other nonspecific skin eruption: Secondary | ICD-10-CM

## 2013-07-24 MED ORDER — TRIAMCINOLONE ACETONIDE 0.1 % EX CREA: TOPICAL_CREAM | Freq: Two times a day (BID) | CUTANEOUS | Status: DC | PRN

## 2013-07-24 NOTE — Progress Notes (Signed)
Rash on L arm and stomach. Itchy.  Present a few weeks. No clear trigger.  No help with OTC cream. She used permethrin cream last week w/o any effect (son had scabies, but that was a few months ago).  No lesions on palms or soles. No FCNAVD. She feels well except for the rash/itch.  Itch is constant.    Meds, vitals, and allergies reviewed.   ROS: See HPI.  Otherwise, noncontributory.  nad blanching irregular collection of red/pink small macules on the L flexor side of forearm and on abd, back No lesions on hands.   No oral lesions

## 2013-07-24 NOTE — Assessment & Plan Note (Signed)
Unlikely scabies. Not improved with permethrin.   Possible itch/scratch cycle causing the lesions.  Would use topical TAC and f/u prn.  Doesn't appear infectious.   She agrees.  Topical steroid caution given.

## 2013-07-24 NOTE — Patient Instructions (Addendum)
Use the cream on the affected areas and notify us is not improved.  Take care.

## 2013-10-01 ENCOUNTER — Other Ambulatory Visit: Payer: Self-pay

## 2013-10-02 ENCOUNTER — Other Ambulatory Visit: Payer: Self-pay | Admitting: Family Medicine

## 2013-10-02 NOTE — Telephone Encounter (Signed)
Last office visit 07/24/2013 with Dr. Para March.  Not on medication list.  Ok to refill?

## 2013-11-27 ENCOUNTER — Other Ambulatory Visit: Payer: Self-pay

## 2013-11-27 MED ORDER — TRIAMCINOLONE ACETONIDE 0.1 % EX CREA: TOPICAL_CREAM | Freq: Two times a day (BID) | CUTANEOUS | Status: DC | PRN

## 2013-11-27 MED ORDER — PERMETHRIN 5 % EX CREA: 1.0000 "application " | TOPICAL_CREAM | Freq: Once | CUTANEOUS | Status: DC

## 2013-11-27 NOTE — Telephone Encounter (Signed)
Pt has scabies in 06/2013 and used son's permethrin cream and also got Kenalog cream for itching. Symptoms resolved but have returned with scabies on back and back of legs. Pt request refill kenalog and rx for permethrin cream 5% which is not on med list to [facility]. Pt request cb.

## 2013-11-27 NOTE — Telephone Encounter (Signed)
Sent in Rx's.

## 2014-03-16 ENCOUNTER — Other Ambulatory Visit: Payer: Self-pay | Admitting: Obstetrics and Gynecology

## 2014-04-22 ENCOUNTER — Other Ambulatory Visit: Payer: Self-pay | Admitting: Family Medicine

## 2014-05-17 ENCOUNTER — Encounter (HOSPITAL_COMMUNITY): Payer: Self-pay | Admitting: Pharmacist

## 2014-05-17 NOTE — Patient Instructions (Addendum)
   Your procedure is scheduled on:  Wednesday, June 24  [facility] at: 6 AM Pick up the phone at the desk and dial [phone] and inform us of your arrival.  Please call this number if you have any problems the morning of surgery: [phone]  Remember: Do not eat or drink after midnight: Tuesday Take these medicines the morning of surgery with a SIP OF WATER: Wellbutrin and klonopin if needed  Do not wear jewelry, make-up, or FINGER nail polish No metal in your hair or on your body. Do not wear lotions, powders, perfumes.  You may wear deodorant.  Do not bring valuables to the hospital. Contacts, dentures or bridgework may not be worn into surgery.  Leave suitcase in the car. After Surgery it may be brought to your room. For patients being admitted to the hospital, checkout time is 11:00am the day of discharge.  Home with husband Annie Par[place]Ray [place] cell [phone].

## 2014-05-18 ENCOUNTER — Encounter (HOSPITAL_COMMUNITY): Payer: Self-pay

## 2014-05-18 ENCOUNTER — Encounter (HOSPITAL_COMMUNITY)
Admission: RE | Admit: 2014-05-18 | Discharge: 2014-05-18 | Disposition: A | Discharge status: Home / Self Care | Payer: 59 | Source: Ambulatory Visit | Attending: Obstetrics and Gynecology | Admitting: Obstetrics and Gynecology

## 2014-05-18 HISTORY — DX: Depression, unspecified: F32.A

## 2014-05-18 HISTORY — DX: Anemia, unspecified: D64.9

## 2014-05-18 HISTORY — DX: Major depressive disorder, single episode, unspecified: F32.9

## 2014-05-18 HISTORY — DX: Other seasonal allergic rhinitis: J30.2

## 2014-05-18 HISTORY — DX: Anxiety disorder, unspecified: F41.9

## 2014-05-18 HISTORY — DX: Attention-deficit hyperactivity disorder, unspecified type: F90.9

## 2014-05-18 LAB — CBC
HCT: 43.1 % (ref 36.0–46.0)
HEMOGLOBIN: 14.5 g/dL (ref 12.0–15.0)
MCH: 28 pg (ref 26.0–34.0)
MCHC: 33.6 g/dL (ref 30.0–36.0)
MCV: 83.4 fL (ref 78.0–100.0)
PLATELETS: 289 10*3/uL (ref 150–400)
RBC: 5.17 MIL/uL — ABNORMAL HIGH (ref 3.87–5.11)
RDW: 13.4 % (ref 11.5–15.5)
WBC: 12.9 10*3/uL — AB (ref 4.0–10.5)

## 2014-05-18 MED ORDER — CEFAZOLIN SODIUM 10 G IJ SOLR
3.0000 g | INTRAMUSCULAR | Status: AC
  Administered 2014-05-19: 3 g via INTRAVENOUS
  Filled 2014-05-18: qty 3000

## 2014-05-18 NOTE — H&P (Signed)
Patricia Bender is an 43 y.o. female with menorrhagia. EMB benign.   Pertinent Gynecological History: Menses: flow is excessive with use of many pads or tampons on heaviest days Bleeding: dysfunctional uterine bleeding Contraception: tubal ligation DES exposure: denies Blood transfusions: none Sexually transmitted diseases: no past history Previous GYN Procedures: none  Last mammogram: normal Date: 2015 Last pap: normal Date: 2015 OB History: G7, P4   Menstrual History: Menarche age: unknown  No LMP recorded.    Past Medical History  Diagnosis Date  . SVD (spontaneous vaginal delivery)     x 4  . ADHD (attention deficit hyperactivity disorder)   . Seasonal allergies   . Anxiety   . Depression   . Anemia     History    Past Surgical History  Procedure Laterality Date  . Cholecystectomy  03/2011  . Tubal ligation    . Wisdom tooth extraction      No family history on file.  Social History:  reports that she has been smoking Cigarettes.  She has a 10 pack-year smoking history. She has never used smokeless tobacco. She reports that she does not drink alcohol or use illicit drugs.  Allergies: No Known Allergies  No prescriptions prior to admission    ROS  There were no vitals taken for this visit. Physical Exam  Cardiovascular: Normal rate and regular rhythm.   Respiratory: Effort normal and breath sounds normal.  GI: Soft. There is no tenderness.  Genitourinary:  Uterus 6 weeks size, mobile Adnexa without masses    Results for orders placed during the hospital encounter of 05/18/14 (from the past 24 hour(s))  CBC     Status: Abnormal   Collection Time    05/18/14 10:48 AM      Result Value Ref Range   WBC 12.9 (*) 4.0 - 10.5 K/uL   RBC 5.17 (*) 3.87 - 5.11 MIL/uL   Hemoglobin 14.5  12.0 - 15.0 g/dL   HCT 08.643.1  57.836.0 - 46.946.0 %   MCV 83.4  78.0 - 100.0 fL   MCH 28.0  26.0 - 34.0 pg   MCHC 33.6  30.0 - 36.0 g/dL   RDW 62.913.4  52.811.5 - 41.315.5 %   Platelets 289   150 - 400 K/uL    No results found.  Assessment/Plan: 43 yo G7P4 with menorrhagia D/W patient LAVH, bilateral salpingectomy, removal of ovary if abnormal Risks reviewed including infection, organ damage, bleeding/transfusion-HIV/Hep, DVT/PE, pneumonia, pelvic/abdominal pain, painful intercourse, laparotomy, return to OR, fistula. All questions answered.  TOMBLIN II,JAMES E 05/18/2014, 7:37 PM

## 2014-05-19 ENCOUNTER — Ambulatory Visit (HOSPITAL_COMMUNITY): Payer: 59 | Admitting: Anesthesiology

## 2014-05-19 ENCOUNTER — Ambulatory Visit (HOSPITAL_COMMUNITY)
Admission: RE | Admit: 2014-05-19 | Discharge: 2014-05-19 | Disposition: A | Discharge status: Home / Self Care | Payer: 59 | Source: Ambulatory Visit | Attending: Obstetrics and Gynecology | Admitting: Obstetrics and Gynecology

## 2014-05-19 ENCOUNTER — Encounter (HOSPITAL_COMMUNITY): Payer: Self-pay | Admitting: *Deleted

## 2014-05-19 ENCOUNTER — Encounter (HOSPITAL_COMMUNITY): Payer: 59 | Admitting: Anesthesiology

## 2014-05-19 ENCOUNTER — Encounter (HOSPITAL_COMMUNITY)
Admission: RE | Disposition: A | Discharge status: Home / Self Care | Payer: Self-pay | Source: Ambulatory Visit | Attending: Obstetrics and Gynecology

## 2014-05-19 ENCOUNTER — Ambulatory Visit (HOSPITAL_COMMUNITY): Payer: 59

## 2014-05-19 ENCOUNTER — Telehealth: Payer: Self-pay | Admitting: Pulmonary Disease

## 2014-05-19 DIAGNOSIS — F341 Dysthymic disorder: Secondary | ICD-10-CM | POA: Insufficient documentation

## 2014-05-19 DIAGNOSIS — Z6841 Body Mass Index (BMI) 40.0 and over, adult: Secondary | ICD-10-CM | POA: Insufficient documentation

## 2014-05-19 DIAGNOSIS — N920 Excessive and frequent menstruation with regular cycle: Secondary | ICD-10-CM | POA: Insufficient documentation

## 2014-05-19 DIAGNOSIS — J683 Other acute and subacute respiratory conditions due to chemicals, gases, fumes and vapors: Secondary | ICD-10-CM

## 2014-05-19 DIAGNOSIS — F17200 Nicotine dependence, unspecified, uncomplicated: Secondary | ICD-10-CM | POA: Insufficient documentation

## 2014-05-19 DIAGNOSIS — J45909 Unspecified asthma, uncomplicated: Secondary | ICD-10-CM

## 2014-05-19 DIAGNOSIS — Z9851 Tubal ligation status: Secondary | ICD-10-CM | POA: Insufficient documentation

## 2014-05-19 HISTORY — PX: LAPAROSCOPY: SHX197

## 2014-05-19 SURGERY — LAPAROSCOPY, DIAGNOSTIC
Anesthesia: General | Site: Abdomen

## 2014-05-19 MED ORDER — BUPIVACAINE HCL (PF) 0.5 % IJ SOLN
INTRAMUSCULAR | Status: AC
  Filled 2014-05-19: qty 30

## 2014-05-19 MED ORDER — HEPARIN SODIUM (PORCINE) 5000 UNIT/ML IJ SOLN
INTRAMUSCULAR | Status: AC
  Filled 2014-05-19: qty 1

## 2014-05-19 MED ORDER — MIDAZOLAM HCL 2 MG/2ML IJ SOLN
INTRAMUSCULAR | Status: AC
  Filled 2014-05-19: qty 2

## 2014-05-19 MED ORDER — LACTATED RINGERS IV SOLN
INTRAVENOUS | Status: DC
  Administered 2014-05-19 (×2): via INTRAVENOUS

## 2014-05-19 MED ORDER — PROPOFOL 10 MG/ML IV BOLUS
INTRAVENOUS | Status: DC | PRN
  Administered 2014-05-19: 200 mg via INTRAVENOUS

## 2014-05-19 MED ORDER — OXYCODONE HCL 5 MG PO TABS: 5.0000 mg | ORAL_TABLET | Freq: Once | ORAL | Status: DC | PRN

## 2014-05-19 MED ORDER — ACETAMINOPHEN 325 MG PO TABS: 325.0000 mg | ORAL_TABLET | ORAL | Status: DC | PRN

## 2014-05-19 MED ORDER — BUPIVACAINE HCL (PF) 0.5 % IJ SOLN
INTRAMUSCULAR | Status: DC | PRN
  Administered 2014-05-19: 6 mL

## 2014-05-19 MED ORDER — ALBUTEROL SULFATE HFA 108 (90 BASE) MCG/ACT IN AERS: 2.0000 | INHALATION_SPRAY | Freq: Four times a day (QID) | RESPIRATORY_TRACT | Status: DC | PRN

## 2014-05-19 MED ORDER — PROPOFOL 10 MG/ML IV EMUL
INTRAVENOUS | Status: AC
  Filled 2014-05-19: qty 20

## 2014-05-19 MED ORDER — ROCURONIUM BROMIDE 100 MG/10ML IV SOLN
INTRAVENOUS | Status: DC | PRN
  Administered 2014-05-19: 20 mg via INTRAVENOUS
  Administered 2014-05-19: 30 mg via INTRAVENOUS

## 2014-05-19 MED ORDER — OXYCODONE HCL 5 MG/5ML PO SOLN: 5.0000 mg | Freq: Once | ORAL | Status: DC | PRN

## 2014-05-19 MED ORDER — FENTANYL CITRATE 0.05 MG/ML IJ SOLN
INTRAMUSCULAR | Status: AC
  Filled 2014-05-19: qty 2

## 2014-05-19 MED ORDER — MIDAZOLAM HCL 2 MG/2ML IJ SOLN
INTRAMUSCULAR | Status: DC | PRN
  Administered 2014-05-19: 2 mg via INTRAVENOUS

## 2014-05-19 MED ORDER — GLYCOPYRROLATE 0.2 MG/ML IJ SOLN
INTRAMUSCULAR | Status: DC | PRN
  Administered 2014-05-19: 0.6 mg via INTRAVENOUS

## 2014-05-19 MED ORDER — LIDOCAINE HCL (CARDIAC) 20 MG/ML IV SOLN
INTRAVENOUS | Status: AC
  Filled 2014-05-19: qty 5

## 2014-05-19 MED ORDER — SODIUM CHLORIDE 0.9 % IJ SOLN
INTRAMUSCULAR | Status: AC
  Filled 2014-05-19: qty 10

## 2014-05-19 MED ORDER — FENTANYL CITRATE 0.05 MG/ML IJ SOLN
INTRAMUSCULAR | Status: DC | PRN
  Administered 2014-05-19: 100 ug via INTRAVENOUS

## 2014-05-19 MED ORDER — ROCURONIUM BROMIDE 100 MG/10ML IV SOLN
INTRAVENOUS | Status: AC
  Filled 2014-05-19: qty 1

## 2014-05-19 MED ORDER — NEOSTIGMINE METHYLSULFATE 10 MG/10ML IV SOLN
INTRAVENOUS | Status: DC | PRN
  Administered 2014-05-19: 4 mg via INTRAVENOUS

## 2014-05-19 MED ORDER — ACETAMINOPHEN 160 MG/5ML PO SOLN: 325.0000 mg | ORAL | Status: DC | PRN

## 2014-05-19 MED ORDER — GLYCOPYRROLATE 0.2 MG/ML IJ SOLN
INTRAMUSCULAR | Status: AC
  Filled 2014-05-19: qty 3

## 2014-05-19 MED ORDER — NEOSTIGMINE METHYLSULFATE 10 MG/10ML IV SOLN
INTRAVENOUS | Status: AC
  Filled 2014-05-19: qty 1

## 2014-05-19 MED ORDER — PROMETHAZINE HCL 25 MG/ML IJ SOLN: 6.2500 mg | INTRAMUSCULAR | Status: DC | PRN

## 2014-05-19 MED ORDER — FENTANYL CITRATE 0.05 MG/ML IJ SOLN: 25.0000 ug | INTRAMUSCULAR | Status: DC | PRN

## 2014-05-19 MED ORDER — FENTANYL CITRATE 0.05 MG/ML IJ SOLN
INTRAMUSCULAR | Status: AC
Start: 2014-05-19 — End: 2014-05-19
  Filled 2014-05-19: qty 5

## 2014-05-19 SURGICAL SUPPLY — 46 items
ADH SKN CLS APL DERMABOND .7 (GAUZE/BANDAGES/DRESSINGS) ×1
CABLE HIGH FREQUENCY MONO STRZ (ELECTRODE) IMPLANT
CATH ROBINSON RED A/P 16FR (CATHETERS) ×5 IMPLANT
CLOSURE WOUND 1/4 X3 (GAUZE/BANDAGES/DRESSINGS)
CLOTH BEACON ORANGE TIMEOUT ST (SAFETY) ×5 IMPLANT
CONT PATH 16OZ SNAP LID 3702 (MISCELLANEOUS) IMPLANT
COVER TABLE BACK 60X90 (DRAPES) ×5 IMPLANT
DECANTER SPIKE VIAL GLASS SM (MISCELLANEOUS) ×15 IMPLANT
DERMABOND ADVANCED (GAUZE/BANDAGES/DRESSINGS) ×2
DERMABOND ADVANCED .7 DNX12 (GAUZE/BANDAGES/DRESSINGS) ×3 IMPLANT
DRSG COVADERM PLUS 2X2 (GAUZE/BANDAGES/DRESSINGS) IMPLANT
DRSG OPSITE POSTOP 3X4 (GAUZE/BANDAGES/DRESSINGS) ×5 IMPLANT
DURAPREP 26ML APPLICATOR (WOUND CARE) ×5 IMPLANT
ELECT LIGASURE LONG (ELECTRODE) ×5 IMPLANT
ELECT REM PT RETURN 9FT ADLT (ELECTROSURGICAL) ×5
ELECTRODE REM PT RTRN 9FT ADLT (ELECTROSURGICAL) ×3 IMPLANT
FILTER SMOKE EVAC LAPAROSHD (FILTER) IMPLANT
GLOVE BIO SURGEON STRL SZ8 (GLOVE) ×15 IMPLANT
GLOVE BIOGEL PI IND STRL 6.5 (GLOVE) ×3 IMPLANT
GLOVE BIOGEL PI IND STRL 7.0 (GLOVE) ×9 IMPLANT
GLOVE BIOGEL PI INDICATOR 6.5 (GLOVE) ×2
GLOVE BIOGEL PI INDICATOR 7.0 (GLOVE) ×6
GOWN STRL REUS W/ TWL LRG LVL3 (GOWN DISPOSABLE) ×21 IMPLANT
GOWN STRL REUS W/TWL LRG LVL3 (GOWN DISPOSABLE) ×28
NEEDLE INSUFFLATION 120MM (ENDOMECHANICALS) ×5 IMPLANT
NS IRRIG 1000ML POUR BTL (IV SOLUTION) IMPLANT
PACK LAVH (CUSTOM PROCEDURE TRAY) ×5 IMPLANT
PROTECTOR NERVE ULNAR (MISCELLANEOUS) ×5 IMPLANT
SCISSORS LAP 5X45 EPIX DISP (ENDOMECHANICALS) IMPLANT
SEALER TISSUE G2 CVD JAW 45CM (ENDOMECHANICALS) IMPLANT
SET CYSTO W/LG BORE CLAMP LF (SET/KITS/TRAYS/PACK) IMPLANT
SET IRRIG TUBING LAPAROSCOPIC (IRRIGATION / IRRIGATOR) IMPLANT
STRIP CLOSURE SKIN 1/4X3 (GAUZE/BANDAGES/DRESSINGS) IMPLANT
SUT MNCRL 0 MO-4 VIOLET 18 CR (SUTURE) ×6 IMPLANT
SUT MNCRL 0 VIOLET 6X18 (SUTURE) ×3 IMPLANT
SUT MONOCRYL 0 6X18 (SUTURE) ×2
SUT MONOCRYL 0 MO 4 18  CR/8 (SUTURE) ×4
SUT VIC AB 4-0 PS2 27 (SUTURE) ×5 IMPLANT
SUT VICRYL 0 UR6 27IN ABS (SUTURE) ×5 IMPLANT
SYR 30ML LL (SYRINGE) ×5 IMPLANT
TOWEL OR 17X24 6PK STRL BLUE (TOWEL DISPOSABLE) ×10 IMPLANT
TRAY FOLEY CATH 14FR (SET/KITS/TRAYS/PACK) ×5 IMPLANT
TROCAR XCEL NON-BLD 11X100MML (ENDOMECHANICALS) ×5 IMPLANT
TROCAR XCEL NON-BLD 5MMX100MML (ENDOMECHANICALS) ×5 IMPLANT
WARMER LAPAROSCOPE (MISCELLANEOUS) ×5 IMPLANT
WATER STERILE IRR 1000ML POUR (IV SOLUTION) ×5 IMPLANT

## 2014-05-19 NOTE — Telephone Encounter (Signed)
Please call in- albuterol MDI2 puffs every 6 hours when necessary to [facility] for this patient

## 2014-05-19 NOTE — Discharge Instructions (Signed)

## 2014-05-19 NOTE — Anesthesia Preprocedure Evaluation (Addendum)
Anesthesia Evaluation  Patient identified by MRN, date of birth, ID band Patient awake    Reviewed: Allergy & Precautions, H&P , NPO status , Patient's Chart, lab work & pertinent test results  History of Anesthesia Complications Negative for: history of anesthetic complications  Airway Mallampati: III TM Distance: >3 FB Neck ROM: Full    Dental  (+) Teeth Intact,    Pulmonary neg sleep apnea, neg COPDCurrent Smoker,  breath sounds clear to auscultation        Cardiovascular negative cardio ROS  Rhythm:Regular     Neuro/Psych PSYCHIATRIC DISORDERS Anxiety Depression negative neurological ROS     GI/Hepatic negative GI ROS, Neg liver ROS,   Endo/Other  Morbid obesity  Renal/GU negative Renal ROS     Musculoskeletal   Abdominal   Peds  Hematology negative hematology ROS (+)   Anesthesia Other Findings   Reproductive/Obstetrics                          Anesthesia Physical Anesthesia Plan  ASA: II  Anesthesia Plan: General   Post-op Pain Management:    Induction: Intravenous  Airway Management Planned: Oral ETT  Additional Equipment: None  Intra-op Plan:   Post-operative Plan: Extubation in OR  Informed Consent: I have reviewed the patients History and Physical, chart, labs and discussed the procedure including the risks, benefits and alternatives for the proposed anesthesia with the patient or authorized representative who has indicated his/her understanding and acceptance.   Dental advisory given  Plan Discussed with: CRNA and Surgeon  Anesthesia Plan Comments:         Anesthesia Quick Evaluation

## 2014-05-19 NOTE — Consult Note (Signed)
Name: Patricia Bender MRN: [id number] DOB: 1971/05/11    ADMISSION DATE:  05/19/2014 CONSULTATION DATE:  05/19/2014  REFERRING MD :  Brayton CavesFreeman jackson, MD, anesthesia PRIMARY SERVICE:  Mia CreekGyn, Tomblin  CHIEF COMPLAINT:  hypoxia   HISTORY OF PRESENT ILLNESS:  43 year old smoker who was scheduled to have a laparoscopic assisted vaginal hysterectomy today. Induction and endotracheal intubation was uneventful. She developed hypoxia after insufflation of the abdomen for laparoscopy. This improved once the pneumopericardium was decreased. The operation was aborted and we were consulted. She reports smoking half pack per day since the age of 43. 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 She has 4 children youngest aged 41. Her last procedure was laparoscopic cholecystectomy in 2003 which was uneventful. I reviewed notes from her PCP [facility]Bedsole [facility] who has noted reactive airway disease, triggers being allergies and nasal congestion. She was given albuterol MDI many years ago but she has not required over the past year. She denies childhood history of asthma.spirometry on 05/14/2013 shows no evidence of airway obstruction with FEV1 of 91%. She has been maintained on Singulair, which he takes every year from May until August and reports compliance. She denies postnasal drip, recent URI, sick contacts or episodes of heartburn or reflux,  her medications for ADHD and anxiety were also reviewed  PAST MEDICAL HISTORY :  Past Medical History  Diagnosis Date  . SVD (spontaneous vaginal delivery)     x 4  . ADHD (attention deficit hyperactivity disorder)   . Seasonal allergies   . Anxiety   . Depression   . Anemia     History   Past Surgical History  Procedure Laterality Date  . Cholecystectomy  03/2011  . Tubal ligation    . Wisdom tooth extraction     Prior to Admission medications   Medication Sig Start Date End Date Taking? Authorizing Bush Murdoch  buPROPion  (WELLBUTRIN XL) 150 MG 24 hr tablet Take 150 mg by mouth daily.   Yes Historical Nabor Thomann, MD  citalopram (CELEXA) 20 MG tablet Take 20 mg by mouth daily.   Yes Historical Maryjane Benedict, MD  clonazePAM (KLONOPIN) 0.5 MG tablet Take 0.5 mg by mouth as needed for anxiety.    Yes Historical Chayanne Speir, MD  montelukast (SINGULAIR) 10 MG tablet TAKE 1 TABLET BY MOUTH ONCE DAILY AT BEDTIME 04/22/14  Yes Amy E Bedsole, MD  amphetamine-dextroamphetamine (ADDERALL) 30 MG tablet Take 30 mg by mouth daily.     Historical Jahfari Ambers, MD   No Known Allergies  FAMILY HISTORY:  History reviewed. No pertinent family history. SOCIAL HISTORY:  reports that she has been smoking Cigarettes.  She has a 10 pack-year smoking history. She has never used smokeless tobacco. She reports that she does not drink alcohol or use illicit drugs.  REVIEW OF SYSTEMS:   Constitutional: Negative for fever, chills, weight loss, malaise/fatigue and diaphoresis.  HENT: Negative for hearing loss, ear pain, nosebleeds, congestion, sore throat, neck pain, tinnitus and ear discharge.   Eyes: Negative for blurred vision, double vision, photophobia, pain, discharge and redness.  Respiratory: Negative for cough, hemoptysis, sputum production, shortness of breath, wheezing and stridor.   Cardiovascular: Negative for chest pain, palpitations, orthopnea, claudication, leg swelling and PND.  Gastrointestinal: Negative for heartburn, nausea, vomiting, abdominal pain, diarrhea, constipation, blood in stool and melena.  Genitourinary: Negative for dysuria, urgency, frequency, hematuria and flank pain.  Musculoskeletal: Negative for myalgias, back pain, joint pain and falls.  Skin: Negative for  itching and rash.  Neurological: Negative for dizziness, tingling, tremors, sensory change, speech change, focal weakness, seizures, loss of consciousness, weakness and headaches.  Endo/Heme/Allergies: Negative for environmental allergies and polydipsia. Does not  bruise/bleed easily.  SUBJECTIVE:   VITAL SIGNS: Temp:  [97.7 F (36.5 C)-98.2 F (36.8 C)] 98.2 F (36.8 C) (06/24 1215) Pulse Rate:  [69-92] 86 (06/24 1215) Resp:  [12-24] 17 (06/24 1215) BP: (99-125)/(43-84) 110/71 mmHg (06/24 1215) SpO2:  [96 %-100 %] 98 % (06/24 1215)  PHYSICAL EXAMINATION: Gen. Pleasant, well-nourished, in no distress, normal affect ENT - no lesions, no post nasal drip Neck: No JVD, no thyromegaly, no carotid bruits Lungs: no use of accessory muscles, no dullness to percussion, decreased bilateral without rales or rhonchi  Cardiovascular: Rhythm regular, heart sounds  normal, no murmurs, no peripheral edema Abdomen: soft and non-tender, no hepatosplenomegaly, BS normal. Musculoskeletal: No deformities, no cyanosis or clubbing Neuro:  alert, non focal Skin:  Warm, no lesions/ rash   No results found for this basename: NA, K, CL, CO2, BUN, CREATININE, GLUCOSE,  in the last 168 hours  Recent Labs Lab 05/18/14 1048  HGB 14.5  HCT 43.1  WBC 12.9*  PLT 289   Dg Chest Port 1 View  05/19/2014   CLINICAL DATA:  Smoking history, history of oxygen desaturation  EXAM: PORTABLE CHEST - 1 VIEW  COMPARISON:  None.  FINDINGS: The lungs are clear and well aerated. No infiltrate or effusion is seen. Mediastinal and hilar contours appear normal. The heart is within normal limits in size. No bony abnormality is seen.  IMPRESSION: No active disease.   Electronically Signed   By: Dwyane DeePaul  Barry M.D.   On: 05/19/2014 09:12    ASSESSMENT / PLAN:   Perioperative hypoxia - likely related to reactive airways disease and decreased lung compliance after abdominal insufflation.  Recommend- Albuterol MDI 2 puffs every 6 hours as needed for wheezing Followup appointment was made for Monday 6/29 at 1:30 PM, the spirometry will be obtained at that visit. If airway obstruction is noted or persistent symptoms are present, she will be placed on steroid//LABA combination for maintenance  until her surgery.   This was discussed with the patient and anesthesiologist.  Cyril Mourningakesh Alva MD. [facility]. [place] [facility] Pager [phone] If no response call [phone]    05/19/2014, 1:04 PM

## 2014-05-19 NOTE — Transfer of Care (Signed)
Immediate Anesthesia Transfer of Care Note  Patient: Patricia Bender  Procedure(s) Performed: Procedure(s): LAPAROSCOPY DIAGNOSTIC (N/A)  Patient Location: PACU  Anesthesia Type:General  Level of Consciousness: awake, alert  and oriented  Airway & Oxygen Therapy: Patient Spontanous Breathing and Patient connected to nasal cannula oxygen  Post-op Assessment: Report given to PACU RN and Post -op Vital signs reviewed and stable  Post vital signs: Reviewed and stable  Complications: No apparent anesthesia complications

## 2014-05-19 NOTE — Op Note (Signed)
[name]:  [name], [name]             ACCOUNT NO.:  [id number]  MEDICAL RECORD NO.:  [id number]  LOCATION:  [facility]                          [facility]ACILITY:  [facility]  PHYSICIAN:  Guy SandiferJames E. Henderson Cloudomblin, M.D. DATE OF BIRTH:  [birth year]  DATE OF PROCEDURE:  05/19/2014 DATE OF DISCHARGE:                              OPERATIVE REPORT   PREOPERATIVE DIAGNOSIS:  Menorrhagia.  POSTOPERATIVE DIAGNOSIS:  Menorrhagia.  PROCEDURE:  Laparoscopy.  SURGEON:  Guy SandiferJames E. Henderson Cloudomblin, M.D.  ESTIMATED BLOOD LOSS:  Drops.  SPECIMENS:  None.  INDICATIONS AND CONSENT:  This patient is a 43 year old married white female, G7, P4, status post tubal ligation with heavy menses. Laparoscopically assisted vaginal hysterectomy with bilateral salpingectomy and removal of the ovaries only if abnormals have been discussed preoperatively.  Potential risks and complications have been discussed preoperatively including but not limited to, infection, organ damage, bleeding requiring transfusion of blood products with HIV and hepatitis acquisition, DVT, PE, pneumonia, fistula formation, pelvic pain, abdominal pain, laparotomy, and return to the operating room.  All questions have been answered and consent is signed on the chart.  PROCEDURE IN DETAIL:  The patient was taken to the operating room where she was identified, placed in dorsal supine position and general anesthesia was induced via endotracheal intubation by Dr. Brayton CavesFreeman Jackson.  She was placed in a dorsal lithotomy position.  She was prepped, bladder straight catheterized.  Hulka tenaculum was placed in the uterus as a manipulator and she was draped for [facility] protocol.  Time-out was then undertaken.  The infraumbilical and suprapubic areas were injected in midline with approximately 6 mL of 0.5% plain Marcaine.  The disposable Veress needle was placed on the 1st attempt without difficulty.  A good syringe and drop test were noted.  A 2 L of gas were then insufflated  under low pressure with good tympany in the right upper quadrant.  At the time of insufflation, the patient's oxygen saturation was noted to decrease.  Dr. Jean RosenthalJackson was called to the room.  At that point, the Veress needle had been removed from the abdomen.  He requested that I reduce the pneumoperitoneum.  I therefore inserted the 10/11 Xcel bladeless disposable trocar sleeve using direct visualization with the diagnostic laparoscope.  This was done successfully without difficulty.  This identified the peritoneal cavity, and a quick examination revealed no damage to surrounding structures.  The pneumoperitoneum was reduced and the trocar sleeve was removed.  Dr. Jean RosenthalJackson noted that the oxygen saturation had improved. However, the patient had abnormal breath sounds and had an unexplained desaturations.  At that point, after conferring with Dr. Jean RosenthalJackson, I decided to cancel the surgery.  The umbilical incision was closed with a 0 Vicryl suture in the subcutaneous layers under good visualization and interrupted 2-0 Vicryl in the skin.  Dermabond was applied. Hulka tenaculum was removed.  All counts were correct.  The patient was subsequently extubated successfully and was transferred to the PACU in stable condition.  Dr. Jean RosenthalJackson is evaluating the patient, and Pulmonary consultation has been called.     Guy SandiferJames E. Henderson Cloudomblin, M.D.     JET/MEDQ  D:  05/19/2014  T:  05/19/2014  Job:  [id number]

## 2014-05-19 NOTE — Progress Notes (Signed)
No changes to H&P per patient history Reviewed with patient procedure-LAVH, bilateral salpingectomy, removal of ovary if abnormal All questions answered

## 2014-05-19 NOTE — Brief Op Note (Signed)
05/19/2014  8:40 AM  PATIENT:  Patricia Bender  43 y.o. female  PRE-OPERATIVE DIAGNOSIS:  menorrhagia  POST-OPERATIVE DIAGNOSIS:  menorrhagia  PROCEDURE:  Procedure(s): LAPAROSCOPY DIAGNOSTIC (N/A)  SURGEON:  Surgeon(s) and Role:    * Leslie AndreaJames E Tomblin II, MD - Primary  PHYSICIAN ASSISTANT:   ASSISTANTS: none   ANESTHESIA:   general  EBL:  Total I/O In: 1300 [I.V.:1300] Out: -   BLOOD ADMINISTERED:none  DRAINS: none   LOCAL MEDICATIONS USED:  MARCAINE    and Amount: 6 ml  SPECIMEN:  No Specimen  DISPOSITION OF SPECIMEN:  N/A  COUNTS:  YES  TOURNIQUET:  * No tourniquets in log *  DICTATION: .Other Dictation: Dictation Number [id number]  PLAN OF CARE: Discharge to home after PACU  PATIENT DISPOSITION:  PACU - hemodynamically stable.   Delay start of Pharmacological VTE agent (>24hrs) due to surgical blood loss or risk of bleeding: not applicable

## 2014-05-19 NOTE — Anesthesia Postprocedure Evaluation (Signed)
  Anesthesia Post Note  Patient: Patricia Bender  Procedure(s) Performed: Procedure(s) (LRB): LAPAROSCOPY DIAGNOSTIC (N/A)  Anesthesia type: GA  Patient location: PACU  Post pain: Pain level controlled  Post assessment: Post-op Vital signs reviewed  Last Vitals:  Filed Vitals:   05/19/14 1045  BP: 103/66  Pulse: 82  Temp:   Resp: 14    Post vital signs: Reviewed  Level of consciousness: sedated  Complications: Low O2 sat intraop with breath sounds with thick rhonci.  Case cancelled.

## 2014-05-20 ENCOUNTER — Encounter (HOSPITAL_COMMUNITY): Payer: Self-pay | Admitting: Obstetrics and Gynecology

## 2014-05-20 MED FILL — Heparin Sodium (Porcine) Inj 5000 Unit/ML: INTRAMUSCULAR | Qty: 1 | Status: AC

## 2014-05-24 ENCOUNTER — Ambulatory Visit (INDEPENDENT_AMBULATORY_CARE_PROVIDER_SITE_OTHER): Payer: 59 | Admitting: Pulmonary Disease

## 2014-05-24 ENCOUNTER — Other Ambulatory Visit: Payer: 59

## 2014-05-24 ENCOUNTER — Encounter: Payer: Self-pay | Admitting: Pulmonary Disease

## 2014-05-24 VITALS — BP 104/62 | HR 98 | Temp 98.4°F | Ht 66.0 in | Wt 256.6 lb

## 2014-05-24 DIAGNOSIS — F17200 Nicotine dependence, unspecified, uncomplicated: Secondary | ICD-10-CM

## 2014-05-24 DIAGNOSIS — J45909 Unspecified asthma, uncomplicated: Secondary | ICD-10-CM

## 2014-05-24 DIAGNOSIS — J683 Other acute and subacute respiratory conditions due to chemicals, gases, fumes and vapors: Secondary | ICD-10-CM

## 2014-05-24 MED ORDER — BUDESONIDE-FORMOTEROL FUMARATE 160-4.5 MCG/ACT IN AERO: 2.0000 | INHALATION_SPRAY | Freq: Two times a day (BID) | RESPIRATORY_TRACT | Status: DC

## 2014-05-24 NOTE — Patient Instructions (Signed)
Symbicort 160 2 puffs twice daily - RINSE mouth after use, call for Rx if no side effects Use albuterol as needed only Allergy blood test RAST OK to proceed with surgery

## 2014-05-24 NOTE — Progress Notes (Signed)
   Subjective:    Patient ID: Patricia Bender, female    DOB: Sep 08, 1971, 43 y.o.   MRN: [id number]  HPI Gyn - Huntley Decomlin  43 year old smoker for FU of reactive airways. She developed hypoxia after insufflation of the abdomen for laparoscopy. This improved once the pneumoperitoneum was decreased. The operation was aborted and we were consulted.  She reports smoking half pack per day since the age of 43. 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 Her last procedure was laparoscopic cholecystectomy in 2003 which was uneventful.Her PCP Dr[facility]Bedsole [facility] who has noted reactive airway disease, triggers being allergies and nasal congestion. She was given albuterol MDI many years ago but she has not required over the past year. She denies childhood history of asthma. She has been maintained on Singulair, which he takes every year from May until August and reports compliance.   spirometry on 05/14/2013 shows no evidence of airway obstruction with FEV1 of 91%.  Spirometry - FEv1 84%, FVC 91%, ratio 77 She denies postnasal drip, recent URI, sick contacts or episodes of heartburn or reflux,  She also has ADHD and anxiety   She has used Albuterol MDI 2 puffs on occasion   Review of Systems neg for any significant sore throat, dysphagia, itching, sneezing, nasal congestion or excess/ purulent secretions, fever, chills, sweats, unintended wt loss, pleuritic or exertional cp, hempoptysis, orthopnea pnd or change in chronic leg swelling. Also denies presyncope, palpitations, heartburn, abdominal pain, nausea, vomiting, diarrhea or change in bowel or urinary habits, dysuria,hematuria, rash, arthralgias, visual complaints, headache, numbness weakness or ataxia.     Objective:   Physical Exam Gen. Pleasant, obese, in no distress ENT - no lesions, no post nasal drip Neck: No JVD, no thyromegaly, no carotid bruits Lungs: no use of accessory  muscles, no dullness to percussion, decreased without rales or rhonchi  Cardiovascular: Rhythm regular, heart sounds  normal, no murmurs or gallops, no peripheral edema Musculoskeletal: No deformities, no cyanosis or clubbing , no tremors        Assessment & Plan:

## 2014-05-24 NOTE — Assessment & Plan Note (Signed)
Symbicort 160 2 puffs twice daily - RINSE mouth after use, call for Rx if no side effects Use albuterol as needed only Allergy blood test RAST OK to proceed with surgery - we will maintain her on steroid/LABA combination until after then step down Smoking cessation emphasized

## 2014-05-25 LAB — ALLERGY FULL PROFILE
Allergen, D pternoyssinus,d7: 0.2 kU/L — ABNORMAL HIGH
Allergen,Goose feathers, e70: <0.1 kU/L
BERMUDA GRASS: 0.1 kU/L — AB
Bahia Grass: <0.1 kU/L
Box Elder IgE: <0.1 kU/L
Common Ragweed: <0.1 kU/L
Curvularia lunata: <0.1 kU/L
D. farinae: 0.17 kU/L — ABNORMAL HIGH
Elm IgE: <0.1 kU/L
Fescue: <0.1 kU/L
G005 Rye, Perennial: <0.1 kU/L
G009 Red Top: <0.1 kU/L
Goldenrod: <0.1 kU/L
Helminthosporium halodes: <0.1 kU/L
IGE (IMMUNOGLOBULIN E), SERUM: 19.6 [IU]/mL (ref 0.0–180.0)
Oak: <0.1 kU/L
Plantain: <0.1 kU/L
Sycamore Tree: <0.1 kU/L
Timothy Grass: <0.1 kU/L

## 2014-05-27 ENCOUNTER — Telehealth: Payer: Self-pay | Admitting: Pulmonary Disease

## 2014-05-27 NOTE — Telephone Encounter (Signed)
Called pt regarding lab results: Result Note     Low grade pos to dust & grass    No meds required    I spoke with patient about results and she verbalized understanding and had no questions

## 2014-06-23 ENCOUNTER — Ambulatory Visit (INDEPENDENT_AMBULATORY_CARE_PROVIDER_SITE_OTHER): Payer: 59 | Admitting: Adult Health

## 2014-06-23 ENCOUNTER — Encounter: Payer: Self-pay | Admitting: Adult Health

## 2014-06-23 VITALS — BP 110/68 | HR 89 | Temp 98.1°F | Ht 66.0 in | Wt 254.0 lb

## 2014-06-23 DIAGNOSIS — J301 Allergic rhinitis due to pollen: Secondary | ICD-10-CM

## 2014-06-23 DIAGNOSIS — J45909 Unspecified asthma, uncomplicated: Secondary | ICD-10-CM

## 2014-06-23 DIAGNOSIS — J683 Other acute and subacute respiratory conditions due to chemicals, gases, fumes and vapors: Secondary | ICD-10-CM

## 2014-06-23 MED ORDER — BUDESONIDE-FORMOTEROL FUMARATE 160-4.5 MCG/ACT IN AERO: 2.0000 | INHALATION_SPRAY | Freq: Two times a day (BID) | RESPIRATORY_TRACT | Status: DC

## 2014-06-23 NOTE — Assessment & Plan Note (Signed)
Compensated on present regimen  Will cont on symbicort until after surgery  follow up to determine if symbicort needed post surgery   Plan  Continue on Symbicort 160 2 puffs twice daily - RINSE mouth after use Use albuterol as needed only Follow up Dr. Vassie LollAlva  In 2 months and As needed   Good luck on upcoming surgery.

## 2014-06-23 NOTE — Progress Notes (Signed)
Reviewed & agree with plan  

## 2014-06-23 NOTE — Progress Notes (Signed)
   Subjective:    Patient ID: Patricia NiemannAmanda J Bender, female    DOB: Feb 02, 1971, 43 y.o.   MRN: [id number]  HPI  Gyn - Patricia Bender  43 year old smoker for FU of reactive airways. She developed hypoxia after insufflation of the abdomen for laparoscopy. This improved once the pneumoperitoneum was decreased. The operation was aborted and we were consulted.  She reports smoking half pack per day since the age of 43. 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 Her last procedure was laparoscopic cholecystectomy in 2003 which was uneventful.Her PCP Dr[facility]Bedsole [facility] who has noted reactive airway disease, triggers being allergies and nasal congestion. She was given albuterol MDI many years ago but she has not required over the past year. She denies childhood history of asthma. She has been maintained on Singulair, which he takes every year from May until August and reports compliance.   spirometry on 05/14/2013 shows no evidence of airway obstruction with FEV1 of 91%.  Spirometry - FEv1 84%, FVC 91%, ratio 77 She denies postnasal drip, recent URI, sick contacts or episodes of heartburn or reflux,  She also has ADHD and anxiety   She has used Albuterol MDI 2 puffs on occasion  06/23/2014 Follow up -RAD  Returns for 1 month follow up .  Seen last ov for reactive airways with triggers of post nasal drainage and smoking.  She was started on Symbicort.  Continues to smoke , advised on smoking cessation.  Says she has no interest in stopping smoking -"it is her right to smoke"  She denies chest pain, orthopnea, hemoptysis , fever, dyspnea.  CXR last ov nml, w/ clear lungs.  Says she can not tell any difference in her breathing w/ symbicort -denies wheezing/shortness of breath or cough.  Has daily nasal stuffiness/drainage.  Has Hysterectomy scheduled for September.  IgE 19, Rast low grade post to dust/grass    Review of Systems  neg for any  significant sore throat, dysphagia, itching, sneezing, fever, chills, sweats, unintended wt loss, pleuritic or exertional cp, hempoptysis, orthopnea pnd or change in chronic leg swelling. Also denies presyncope, palpitations, heartburn, abdominal pain, nausea, vomiting, diarrhea or change in bowel or urinary habits, dysuria,hematuria, rash, arthralgias, visual complaints, headache, numbness weakness or ataxia.     Objective:   Physical Exam  Gen. Pleasant, obese, in no distress ENT - no lesions, clear post nasal drainage Neck: No JVD, no thyromegaly, no carotid bruits Lungs: no use of accessory muscles, no dullness to percussion, decreased without rales or rhonchi  Cardiovascular: Rhythm regular, heart sounds  normal, no murmurs or gallops, no peripheral edema Musculoskeletal: No deformities, no cyanosis or clubbing , no tremors        Assessment & Plan:

## 2014-06-23 NOTE — Assessment & Plan Note (Signed)
Saline nasal rinses  Allegra As needed

## 2014-06-23 NOTE — Addendum Note (Signed)
Addended by: Boone MasterJONES, Justn Quale E on: 06/23/2014 09:22 AM   Modules accepted: Orders, Medications

## 2014-06-23 NOTE — Patient Instructions (Signed)
Continue on Symbicort 160 2 puffs twice daily - RINSE mouth after use Use albuterol as needed only Follow up Dr. Vassie LollAlva  In 2 months and As needed   Good luck on upcoming surgery.

## 2014-08-24 ENCOUNTER — Inpatient Hospital Stay (HOSPITAL_COMMUNITY): Admission: RE | Admit: 2014-08-24 | Payer: 59 | Source: Ambulatory Visit | Admitting: Obstetrics and Gynecology

## 2014-08-24 ENCOUNTER — Encounter (HOSPITAL_COMMUNITY): Admission: RE | Payer: Self-pay | Source: Ambulatory Visit

## 2014-08-24 SURGERY — HYSTERECTOMY, VAGINAL, LAPAROSCOPY-ASSISTED
Anesthesia: Regional

## 2014-09-06 ENCOUNTER — Other Ambulatory Visit: Payer: Self-pay | Admitting: Family Medicine

## 2014-10-04 ENCOUNTER — Encounter: Payer: Self-pay | Admitting: Internal Medicine

## 2014-10-04 ENCOUNTER — Ambulatory Visit (INDEPENDENT_AMBULATORY_CARE_PROVIDER_SITE_OTHER): Payer: 59 | Admitting: Internal Medicine

## 2014-10-04 VITALS — BP 118/70 | HR 78 | Temp 98.2°F | Wt 255.0 lb

## 2014-10-04 DIAGNOSIS — J0100 Acute maxillary sinusitis, unspecified: Secondary | ICD-10-CM

## 2014-10-04 MED ORDER — MONTELUKAST SODIUM 10 MG PO TABS: ORAL_TABLET | ORAL | Status: DC

## 2014-10-04 MED ORDER — AMOXICILLIN-POT CLAVULANATE 875-125 MG PO TABS: 1.0000 | ORAL_TABLET | Freq: Two times a day (BID) | ORAL | Status: DC

## 2014-10-04 MED ORDER — PREDNISONE 10 MG PO TABS
ORAL_TABLET | ORAL | Status: DC
Start: 2014-10-04 — End: 2014-10-13

## 2014-10-04 NOTE — Patient Instructions (Signed)

## 2014-10-04 NOTE — Progress Notes (Signed)
Pre visit review using our clinic review tool, if applicable. No additional management support is needed unless otherwise documented below in the visit note. 

## 2014-10-04 NOTE — Progress Notes (Signed)
HPI  Pt presents today with upper respiratory symptoms and sinus issues. She has been sick for about 8 days and is getting worse. She endorses shortness of breath, wheezing, productive cough, ear pain, facial pain, and nasal congestion. She does deny fever/chills. But feels very fatigued.   Review of Systems    Past Medical History  Diagnosis Date  . SVD (spontaneous vaginal delivery)     x 4  . ADHD (attention deficit hyperactivity disorder)   . Seasonal allergies   . Anxiety   . Depression   . Anemia     History    No family history on file.  History   Social History  . Marital Status: Married    Spouse Name: N/A    Number of Children: 4  . Years of Education: N/A   Occupational History  . [employer]    Social History Main Topics  . Smoking status: Current Every Day Smoker -- 0.50 packs/day for 20 years    Types: Cigarettes  . Smokeless tobacco: Never Used  . Alcohol Use: No  . Drug Use: No  . Sexual Activity: Yes    Birth Control/ Protection: Surgical   Other Topics Concern  . Not on file   Social History Narrative   Works in Pathology at [employer]    No Known Allergies   Constitutional: Positive headache, fatigue and fever. Denies abrupt weight changes.  HEENT:  Positive eye pain, pressure behind the eyes, facial pain, nasal congestion, ear pain and sore throat. Denies eye redness,  ringing in the ears, wax buildup, runny nose or bloody nose. Respiratory: Positive cough. Denies difficulty breathing or shortness of breath.  Cardiovascular: Denies chest pain, chest tightness, palpitations or swelling in the hands or feet.   No other specific complaints in a complete review of systems (except as listed in HPI above).  Objective:    General: Appears his stated age, well developed, well nourished in NAD. HEENT: Head: normal shape and size, sinus tenderness noted; Eyes: sclera white, no icterus, conjunctiva pink, PERRLA and EOMs intact; Ears: Bilateral otitis  effusions with serous fluid collections; distorted light reflex.   Nose: mucosa pink and moist, septum midline, maxillary and facial tenderness to palpation; Throat/Mouth: + PND. Teeth present, mucosa pink and moist, no exudate noted, no lesions or ulcerations noted.  Neck: Neck supple, trachea midline. No massses, lumps or thyromegaly present.  Cardiovascular: Normal rate and rhythm. S1,S2 noted.  No murmur, rubs or gallops noted. No JVD or BLE edema. No carotid bruits noted. Pulmonary/Chest: Bilateral wheezing throughout lungs.      Assessment & Plan:   Acute bacterial sinusitis  Can use a Neti Pot which can be purchased from your local drug store. Flonase 2 sprays each nostril for 3 days and then as needed. Augmentin BID for 10 days Will refill Singular Rx for Pred pack.   RTC as needed or if symptoms persist.  Dave Mergen, Jacques Earthlyourtney S, Student-NP

## 2014-10-04 NOTE — Progress Notes (Signed)
HPI  Pt presents to the clinic today with c/o headache, facial pain and pressure, nasal congestion, sore throat and cough. She reports this started about 1 week ago. She is blowing thick green mucous out of her nose. She denies fever, but has had chills and body aches. She has tried Claritin D and Nyquil without much relief. She does have a history of seasonal allergies. She has had sick contacts.  Review of Systems    Past Medical History  Diagnosis Date  . SVD (spontaneous vaginal delivery)     x 4  . ADHD (attention deficit hyperactivity disorder)   . Seasonal allergies   . Anxiety   . Depression   . Anemia     History    No family history on file.  History   Social History  . Marital Status: Married    Spouse Name: N/A    Number of Children: 4  . Years of Education: N/A   Occupational History  . [employer]    Social History Main Topics  . Smoking status: Current Every Day Smoker -- 0.50 packs/day for 20 years    Types: Cigarettes  . Smokeless tobacco: Never Used  . Alcohol Use: No  . Drug Use: No  . Sexual Activity: Yes    Birth Control/ Protection: Surgical   Other Topics Concern  . Not on file   Social History Narrative   Works in Pathology at [employer]    No Known Allergies   Constitutional: Positive headache, fatigue. Denies fever or abrupt weight changes.  HEENT:  Positive facial pain, nasal congestion and sore throat. Denies eye redness, ear pain, ringing in the ears, wax buildup, runny nose or bloody nose. Respiratory: Positive cough. Denies difficulty breathing or shortness of breath.  Cardiovascular: Denies chest pain, chest tightness, palpitations or swelling in the hands or feet.   No other specific complaints in a complete review of systems (except as listed in HPI above).  Objective:  BP 118/70 mmHg  Pulse 78  Temp(Src) 98.2 F (36.8 C) (Oral)  Wt 255 lb (115.667 kg)  SpO2 96%   General: Appears her stated age, ill appearing in NAD. HEENT:  Head: normal shape and size, maxillary sinus tenderness noted;  Ears: Tm's gray and intact, normal light reflex + effusion bilaterally; Nose: mucosa pink and moist, septum midline; Throat/Mouth: + PND. Teeth present, mucosa pink and moist, no exudate noted, no lesions or ulcerations noted.  Cardiovascular: Normal rate and rhythm. S1,S2 noted.  No murmur, rubs or gallops noted. Pulmonary/Chest: Normal effort and intermittent expiratory wheeze noted. No respiratory distress. No rales or ronchi noted.      Assessment & Plan:   Acute maxillary bacterial sinusitis  Can use a Neti Pot which can be purchased from your local drug store. Flonase 2 sprays each nostril for 3 days and then as needed. Augmentin BID for 10 days Prednisone Taper x 6 days  Refilled Singulair  RTC as needed or if symptoms persist.

## 2014-10-13 ENCOUNTER — Ambulatory Visit (INDEPENDENT_AMBULATORY_CARE_PROVIDER_SITE_OTHER): Payer: 59 | Admitting: Pulmonary Disease

## 2014-10-13 ENCOUNTER — Other Ambulatory Visit (INDEPENDENT_AMBULATORY_CARE_PROVIDER_SITE_OTHER): Payer: 59

## 2014-10-13 ENCOUNTER — Encounter: Payer: Self-pay | Admitting: Pulmonary Disease

## 2014-10-13 VITALS — BP 104/74 | HR 87 | Ht 65.0 in | Wt 263.0 lb

## 2014-10-13 DIAGNOSIS — R600 Localized edema: Secondary | ICD-10-CM

## 2014-10-13 DIAGNOSIS — J683 Other acute and subacute respiratory conditions due to chemicals, gases, fumes and vapors: Secondary | ICD-10-CM

## 2014-10-13 DIAGNOSIS — J4520 Mild intermittent asthma, uncomplicated: Secondary | ICD-10-CM

## 2014-10-13 LAB — BRAIN NATRIURETIC PEPTIDE: Pro B Natriuretic peptide (BNP): 6 pg/mL (ref 0.0–100.0)

## 2014-10-13 LAB — TSH: TSH: 2.63 u[IU]/mL (ref 0.35–4.50)

## 2014-10-13 MED ORDER — DOXYCYCLINE HYCLATE 100 MG PO TABS: 100.0000 mg | ORAL_TABLET | Freq: Two times a day (BID) | ORAL | Status: DC

## 2014-10-13 NOTE — Patient Instructions (Signed)
Blood work - TSH & BNP echocardiogram for heart You have to quit smoking Doxy 100 x 7 days - take probiotic with this Good luck with surgery in Jan

## 2014-10-13 NOTE — Progress Notes (Signed)
   Subjective:    Patient ID: Patricia Bender, female    DOB: 1971-09-19, 43 y.o.   MRN: [id number]  HPI  Gyn - Huntley Decomlin  43 year old smoker for FU of reactive airways. She reports smoking half pack per day since the age of 43. 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 She has 4 children youngest aged 41.  Her PCP [facility]rmal[facility]Bedsole [facility] had noted reactive airway disease, triggers being allergies and nasal congestion. She was maintained on Singulair, which he takes every year from May until August and reports compliance.  She also has ADHD and anxiety  Significant tests/ events  04/2014 She developed hypoxia after insufflation of the abdomen for laparoscopy. This improved once the pneumoperitoneum was decreased. The operation was aborted and we were consulted.  Her last procedure was laparoscopic cholecystectomy in 2003 which was uneventful.  spirometry  05/14/2013 shows no evidence of airway obstruction with FEV1 of 91%.  Spirometry 04/2014 - FEv1 84%, FVC 91%, ratio 77 IgE 19, Rast low grade post to dust/grass    10/13/2014  Chief Complaint  Patient presents with  . Follow-up    f/u RAD; sinus infection, not clearing up; coughing up greenish gray colored mucus; chest congestion   SInus infection--> chest cold -> took augmentin & pred taper Still has green phlegm, wheezing better C/o puffiness of hands & feet TSH 3.1 2012 Had cardiac eval 10 y ago Continues to smoke - only way to keep wt in check   She was started on Symbicort 04/2014-she is  not sure this is helping    Has Hysterectomy scheduled for Jan    Past Medical History  Diagnosis Date  . SVD (spontaneous vaginal delivery)     x 4  . ADHD (attention deficit hyperactivity disorder)   . Seasonal allergies   . Anxiety   . Depression   . Anemia     History      Review of Systems neg for any significant sore throat, dysphagia, itching, sneezing, nasal congestion or excess/ purulent  secretions, fever, chills, sweats, unintended wt loss, pleuritic or exertional cp, hempoptysis, orthopnea pnd or change in chronic leg swelling. Also denies presyncope, palpitations, heartburn, abdominal pain, nausea, vomiting, diarrhea or change in bowel or urinary habits, dysuria,hematuria, rash, arthralgias, visual complaints, headache, numbness weakness or ataxia.     Objective:   Physical Exam  Gen. Pleasant, obese, in no distress ENT - no lesions, no post nasal drip Neck: No JVD, no thyromegaly, no carotid bruits Lungs: no use of accessory muscles, no dullness to percussion, decreased without rales or rhonchi  Cardiovascular: Rhythm regular, heart sounds  normal, no murmurs or gallops, no peripheral edema Musculoskeletal: No deformities, no cyanosis or clubbing , no tremors        Assessment & Plan:

## 2014-10-13 NOTE — Assessment & Plan Note (Signed)
Treat as acute bronchitis You have to quit smoking Doxy 100 x 7 days - take probiotic with this Good luck with surgery in Jan

## 2014-10-13 NOTE — Assessment & Plan Note (Signed)
Blood work - TSH & BNP echocardiogram for heart

## 2014-10-14 ENCOUNTER — Telehealth: Payer: Self-pay | Admitting: Pulmonary Disease

## 2014-10-14 NOTE — Telephone Encounter (Signed)
Patient notified of lab results. Nothing further needed.  

## 2014-10-18 ENCOUNTER — Other Ambulatory Visit (HOSPITAL_COMMUNITY): Payer: 59

## 2014-10-19 ENCOUNTER — Encounter (HOSPITAL_COMMUNITY): Payer: Self-pay | Admitting: Pulmonary Disease

## 2014-10-26 ENCOUNTER — Encounter (HOSPITAL_COMMUNITY): Payer: Self-pay | Admitting: Pulmonary Disease

## 2014-12-02 ENCOUNTER — Ambulatory Visit (HOSPITAL_COMMUNITY): Payer: 59 | Attending: Cardiology | Admitting: Radiology

## 2014-12-02 DIAGNOSIS — Z720 Tobacco use: Secondary | ICD-10-CM | POA: Insufficient documentation

## 2014-12-02 DIAGNOSIS — E669 Obesity, unspecified: Secondary | ICD-10-CM | POA: Diagnosis not present

## 2014-12-02 DIAGNOSIS — R600 Localized edema: Secondary | ICD-10-CM | POA: Diagnosis present

## 2014-12-02 DIAGNOSIS — R0600 Dyspnea, unspecified: Secondary | ICD-10-CM | POA: Diagnosis not present

## 2014-12-02 DIAGNOSIS — I071 Rheumatic tricuspid insufficiency: Secondary | ICD-10-CM | POA: Insufficient documentation

## 2014-12-02 NOTE — Progress Notes (Signed)
Echocardiogram performed.  

## 2014-12-03 ENCOUNTER — Telehealth: Payer: Self-pay | Admitting: Pulmonary Disease

## 2014-12-03 NOTE — Telephone Encounter (Signed)
Patient notified of Echo results.  Nothing further needed.

## 2014-12-07 ENCOUNTER — Ambulatory Visit (INDEPENDENT_AMBULATORY_CARE_PROVIDER_SITE_OTHER): Payer: 59 | Admitting: Family Medicine

## 2014-12-07 ENCOUNTER — Encounter: Payer: Self-pay | Admitting: Family Medicine

## 2014-12-07 VITALS — BP 102/70 | HR 92 | Temp 98.3°F | Ht 65.0 in | Wt 256.2 lb

## 2014-12-07 DIAGNOSIS — J4540 Moderate persistent asthma, uncomplicated: Secondary | ICD-10-CM

## 2014-12-07 DIAGNOSIS — F331 Major depressive disorder, recurrent, moderate: Secondary | ICD-10-CM

## 2014-12-07 DIAGNOSIS — R600 Localized edema: Secondary | ICD-10-CM

## 2014-12-07 DIAGNOSIS — J0100 Acute maxillary sinusitis, unspecified: Secondary | ICD-10-CM

## 2014-12-07 DIAGNOSIS — J3089 Other allergic rhinitis: Secondary | ICD-10-CM

## 2014-12-07 DIAGNOSIS — J683 Other acute and subacute respiratory conditions due to chemicals, gases, fumes and vapors: Secondary | ICD-10-CM

## 2014-12-07 MED ORDER — MONTELUKAST SODIUM 10 MG PO TABS: ORAL_TABLET | ORAL | Status: DC

## 2014-12-07 NOTE — Assessment & Plan Note (Signed)
Followed by pshyc stable.

## 2014-12-07 NOTE — Patient Instructions (Addendum)
Work on healthy eating, exercise and tolerated and weight loss. Restart singulair. Quit smoking.

## 2014-12-07 NOTE — Assessment & Plan Note (Signed)
Followed by pulm. In part SOB is likely due to obesity, deconditioning and smoking.  Rec smoking cessation and lifestyle changes.

## 2014-12-07 NOTE — Assessment & Plan Note (Signed)
Eval neg so far. May be due to weight , inactivity and ? Venous insuff. Work on exercise and weight loss.

## 2014-12-07 NOTE — Progress Notes (Signed)
   Subjective:    Patient ID: Patricia Bender, female    DOB: June 08, 1971, 44 y.o.   MRN: [id number]  HPI 44 year old female presents for medication refill.  Allergic rhinitis and reactive airways well managed with singulair and symbicort. She uses albuterol prn.   Now followed by Dr., Vassie LollAlva since desat preop last June 2015.  She uses albuterol daily.  She is due for a refill of singulair., helps a lot.  She has eval for SOB: nml ECHO. EKG, TSH, BNP and CMET.  Smoking cessation has been r, .commended, she is working on this, decreasing on her own.  She has follow  up Feb.   She has planned upcoming Hysterectomy planned with GYN (Dr. Henderson Cloudomblin)  Depression/anxiety/ADD, well controlled on celexa,wellbutrin, using clonazepam 1-2 times a week.  On adderall.     Review of Systems  Constitutional: Negative for fever and fatigue.  HENT: Negative for ear pain.   Eyes: Negative for pain.  Respiratory: Positive for shortness of breath. Negative for chest tightness.   Cardiovascular: Negative for chest pain, palpitations and leg swelling.  Gastrointestinal: Negative for abdominal pain.  Genitourinary: Negative for dysuria.       Objective:   Physical Exam  Constitutional: Vital signs are normal. She appears well-developed and well-nourished. She is cooperative.  Non-toxic appearance. She does not appear ill. No distress.  overweight  HENT:  Head: Normocephalic.  Right Ear: Hearing, tympanic membrane, external ear and ear canal normal. Tympanic membrane is not erythematous, not retracted and not bulging.  Left Ear: Hearing, tympanic membrane, external ear and ear canal normal. Tympanic membrane is not erythematous, not retracted and not bulging.  Nose: No mucosal edema or rhinorrhea. Right sinus exhibits no maxillary sinus tenderness and no frontal sinus tenderness. Left sinus exhibits no maxillary sinus tenderness and no frontal sinus tenderness.  Mouth/Throat: Uvula is midline,  oropharynx is clear and moist and mucous membranes are normal.  Eyes: Conjunctivae, EOM and lids are normal. Pupils are equal, round, and reactive to light. Lids are everted and swept, no foreign bodies found.  Neck: Trachea normal and normal range of motion. Neck supple. Carotid bruit is not present. No thyroid mass and no thyromegaly present.  Cardiovascular: Normal rate, regular rhythm, S1 normal, S2 normal, normal heart sounds, intact distal pulses and normal pulses.  Exam reveals no gallop and no friction rub.   No murmur heard. Pulmonary/Chest: Effort normal and breath sounds normal. No tachypnea. No respiratory distress. She has no decreased breath sounds. She has no wheezes. She has no rhonchi. She has no rales.  Abdominal: Soft. Normal appearance and bowel sounds are normal. There is no tenderness.  Neurological: She is alert.  Skin: Skin is warm, dry and intact. No rash noted.  Psychiatric: Her speech is normal and behavior is normal. Judgment and thought content normal. Her mood appears not anxious. Cognition and memory are normal. She does not exhibit a depressed mood.          Assessment & Plan:

## 2014-12-07 NOTE — Progress Notes (Signed)
Pre visit review using our clinic review tool, if applicable. No additional management support is needed unless otherwise documented below in the visit note. 

## 2014-12-07 NOTE — Patient Instructions (Addendum)
   Your procedure is scheduled on:  Tuesday, Jan 26  Enter through the [facility] at:  6 AM Pick up the phone at the desk and dial [phone] and inform us of your arrival.  Please call this number if you have any problems the morning of surgery: [phone]  Remember: Do not eat or drink after midnight: Monday Take these medicines the morning of surgery with a SIP OF WATER:  Celexa, singulair.  Use symbicort inhaler. Bring albuterol inhaler with you on day of surgery.  Do not wear jewelry, make-up, or FINGER nail polish No metal in your hair or on your body. Do not wear lotions, powders, perfumes.  You may wear deodorant.  Do not bring valuables to the hospital. Contacts, dentures or bridgework may not be worn into surgery.  Leave suitcase in the car. After Surgery it may be brought to your room. For patients being admitted to the hospital, checkout time is 11:00am the day of discharge.  Home with mother Corrie DandyMary cell [phone]

## 2014-12-07 NOTE — Assessment & Plan Note (Signed)
Refill singulair as it is significantly helpful.

## 2014-12-08 ENCOUNTER — Encounter (HOSPITAL_COMMUNITY): Payer: Self-pay

## 2014-12-08 ENCOUNTER — Encounter (HOSPITAL_COMMUNITY)
Admission: RE | Admit: 2014-12-08 | Discharge: 2014-12-08 | Disposition: A | Discharge status: Home / Self Care | Payer: 59 | Source: Ambulatory Visit | Attending: Obstetrics and Gynecology | Admitting: Obstetrics and Gynecology

## 2014-12-08 DIAGNOSIS — Z01818 Encounter for other preprocedural examination: Secondary | ICD-10-CM | POA: Insufficient documentation

## 2014-12-08 DIAGNOSIS — N920 Excessive and frequent menstruation with regular cycle: Secondary | ICD-10-CM | POA: Insufficient documentation

## 2014-12-08 LAB — CBC
HCT: 40.7 % (ref 36.0–46.0)
Hemoglobin: 13.3 g/dL (ref 12.0–15.0)
MCH: 26.7 pg (ref 26.0–34.0)
MCHC: 32.7 g/dL (ref 30.0–36.0)
MCV: 81.7 fL (ref 78.0–100.0)
Platelets: 319 10*3/uL (ref 150–400)
RBC: 4.98 MIL/uL (ref 3.87–5.11)
RDW: 14.8 % (ref 11.5–15.5)
WBC: 11.5 10*3/uL — AB (ref 4.0–10.5)

## 2014-12-20 ENCOUNTER — Encounter (HOSPITAL_COMMUNITY): Payer: Self-pay | Admitting: Anesthesiology

## 2014-12-20 NOTE — H&P (Signed)
Patricia Bender is an 44 y.o. female G7P4 S/P BTL with heavy menses. SHG negative for masses in uterine cavity, EMB is benign. Was take to OR here on 05/09/14 where she was noted to have oxygen desaturation upon insufflation of peritoneal cavity. The procedure was terminated. Subsequent pulmonary consultation was C/W bronchospasm. She is now cleared for surgery with pulmonary medicine. Heavy menses continue.  Pertinent Gynecological History: Menses: flow is excessive with use of many pads or tampons on heaviest days Bleeding: dysfunctional uterine bleeding Contraception: tubal ligation DES exposure: denies Blood transfusions: none Sexually transmitted diseases: no past history Previous GYN Procedures: BTL  Last mammogram: normal Date: 2015 Last pap: normal Date: 2015 OB History: G7, P4   Menstrual History: Menarche age: unknown  No LMP recorded.    Past Medical History  Diagnosis Date  . SVD (spontaneous vaginal delivery)     x 4  . ADHD (attention deficit hyperactivity disorder)   . Seasonal allergies   . Anxiety   . Depression   . Anemia     History    Past Surgical History  Procedure Laterality Date  . Cholecystectomy  03/2011  . Tubal ligation    . Wisdom tooth extraction    . Laparoscopy N/A 05/19/2014    Procedure: LAPAROSCOPY DIAGNOSTIC;  Surgeon: Leslie AndreaJames E Sondra Blixt II, MD;  Location: [facility];  Service: Gynecology;  Laterality: N/A;    No family history on file.  Social History:  reports that she has been smoking Cigarettes.  She has a 10.5 pack-year smoking history. She has never used smokeless tobacco. She reports that she does not drink alcohol or use illicit drugs.  Allergies: No Known Allergies  No prescriptions prior to admission    Review of Systems  Constitutional: Negative for fever.    There were no vitals taken for this visit. Physical Exam  Cardiovascular: Normal rate and regular rhythm.   Respiratory: Effort normal and breath sounds normal.  GI:  Soft. There is no tenderness.  Neurological: She has normal reflexes.    No results found for this or any previous visit (from the past 24 hour(s)).  No results found.  Assessment/Plan: 44 yo G7P4 S/P BTL with menorrhagia LAVH/bilateral salpingectomy D/W patient.  Potential risks D/W including but not limited to infection, organ damage, bleeding/transfusion-HIV/Hep, DVT/PE, pneumonia, pelvic pain, abdominal pain, painful intercourse, laparotomy, return to OR. Patient states she understands and agrees. All questions answered.  Bernhard Koskinen II,Laurelyn Terrero E 12/20/2014, 7:38 PM

## 2014-12-20 NOTE — Anesthesia Preprocedure Evaluation (Addendum)
Anesthesia Evaluation  Patient identified by MRN, date of birth, ID band Patient awake    Reviewed: Allergy & Precautions, NPO status , Patient's Chart, lab work & pertinent test results  Airway Mallampati: II  TM Distance: >3 FB Neck ROM: Full    Dental no notable dental hx. (+) Caps,    Pulmonary asthma , Current Smoker,  breath sounds clear to auscultation  Pulmonary exam normal       Cardiovascular negative cardio ROS  Rhythm:Regular Rate:Normal     Neuro/Psych PSYCHIATRIC DISORDERS Anxiety Depression ADHDnegative neurological ROS     GI/Hepatic negative GI ROS, Neg liver ROS,   Endo/Other  Morbid obesity  Renal/GU negative Renal ROS  negative genitourinary   Musculoskeletal negative musculoskeletal ROS (+)   Abdominal (+) + obese,   Peds  Hematology  (+) anemia ,   Anesthesia Other Findings   Reproductive/Obstetrics Menorrhagia                            Anesthesia Physical Anesthesia Plan  ASA: III  Anesthesia Plan: General   Post-op Pain Management:    Induction: Intravenous  Airway Management Planned: Oral ETT  Additional Equipment:   Intra-op Plan:   Post-operative Plan: Extubation in OR  Informed Consent: I have reviewed the patients History and Physical, chart, labs and discussed the procedure including the risks, benefits and alternatives for the proposed anesthesia with the patient or authorized representative who has indicated his/her understanding and acceptance.   Dental advisory given  Plan Discussed with: CRNA, Anesthesiologist and Surgeon  Anesthesia Plan Comments:         Anesthesia Quick Evaluation

## 2014-12-21 ENCOUNTER — Ambulatory Visit (HOSPITAL_COMMUNITY): Payer: 59 | Admitting: Anesthesiology

## 2014-12-21 ENCOUNTER — Observation Stay (HOSPITAL_COMMUNITY)
Admission: RE | Admit: 2014-12-21 | Discharge: 2014-12-22 | Disposition: A | Discharge status: Home / Self Care | Payer: 59 | Source: Ambulatory Visit | Attending: Obstetrics and Gynecology | Admitting: Obstetrics and Gynecology

## 2014-12-21 ENCOUNTER — Encounter (HOSPITAL_COMMUNITY): Payer: Self-pay | Admitting: Anesthesiology

## 2014-12-21 ENCOUNTER — Encounter (HOSPITAL_COMMUNITY)
Admission: RE | Disposition: A | Discharge status: Home / Self Care | Payer: Self-pay | Source: Ambulatory Visit | Attending: Obstetrics and Gynecology

## 2014-12-21 DIAGNOSIS — F909 Attention-deficit hyperactivity disorder, unspecified type: Secondary | ICD-10-CM | POA: Insufficient documentation

## 2014-12-21 DIAGNOSIS — F17210 Nicotine dependence, cigarettes, uncomplicated: Secondary | ICD-10-CM | POA: Insufficient documentation

## 2014-12-21 DIAGNOSIS — N920 Excessive and frequent menstruation with regular cycle: Principal | ICD-10-CM | POA: Diagnosis present

## 2014-12-21 DIAGNOSIS — F329 Major depressive disorder, single episode, unspecified: Secondary | ICD-10-CM | POA: Insufficient documentation

## 2014-12-21 DIAGNOSIS — N800 Endometriosis of uterus: Secondary | ICD-10-CM | POA: Insufficient documentation

## 2014-12-21 DIAGNOSIS — D649 Anemia, unspecified: Secondary | ICD-10-CM | POA: Insufficient documentation

## 2014-12-21 DIAGNOSIS — F419 Anxiety disorder, unspecified: Secondary | ICD-10-CM | POA: Insufficient documentation

## 2014-12-21 HISTORY — PX: LAPAROSCOPIC ASSISTED VAGINAL HYSTERECTOMY: SHX5398

## 2014-12-21 HISTORY — PX: BILATERAL SALPINGECTOMY: SHX5743

## 2014-12-21 LAB — HCG, SERUM, QUALITATIVE: PREG SERUM: NEGATIVE

## 2014-12-21 SURGERY — HYSTERECTOMY, VAGINAL, LAPAROSCOPY-ASSISTED
Anesthesia: General | Site: Abdomen

## 2014-12-21 MED ORDER — GLYCOPYRROLATE 0.2 MG/ML IJ SOLN
INTRAMUSCULAR | Status: AC
  Filled 2014-12-21: qty 4

## 2014-12-21 MED ORDER — FENTANYL CITRATE 0.05 MG/ML IJ SOLN
INTRAMUSCULAR | Status: AC
  Filled 2014-12-21: qty 5

## 2014-12-21 MED ORDER — BUPIVACAINE HCL (PF) 0.5 % IJ SOLN
INTRAMUSCULAR | Status: DC | PRN
  Administered 2014-12-21: 30 mL

## 2014-12-21 MED ORDER — SCOPOLAMINE 1 MG/3DAYS TD PT72
MEDICATED_PATCH | TRANSDERMAL | Status: DC
Start: 2014-12-21 — End: 2014-12-22
  Administered 2014-12-21: 1.5 mg via TRANSDERMAL
  Filled 2014-12-21: qty 1

## 2014-12-21 MED ORDER — HEPARIN SODIUM (PORCINE) 5000 UNIT/ML IJ SOLN
INTRAMUSCULAR | Status: AC
  Filled 2014-12-21: qty 1

## 2014-12-21 MED ORDER — LACTATED RINGERS IV SOLN
INTRAVENOUS | Status: DC
  Administered 2014-12-21 – 2014-12-22 (×3): via INTRAVENOUS

## 2014-12-21 MED ORDER — ONDANSETRON HCL 4 MG/2ML IJ SOLN
INTRAMUSCULAR | Status: DC | PRN
  Administered 2014-12-21: 4 mg via INTRAVENOUS

## 2014-12-21 MED ORDER — MENTHOL 3 MG MT LOZG: 1.0000 | LOZENGE | OROMUCOSAL | Status: DC | PRN

## 2014-12-21 MED ORDER — LACTATED RINGERS IV SOLN
INTRAVENOUS | Status: DC
  Administered 2014-12-21 (×3): via INTRAVENOUS

## 2014-12-21 MED ORDER — MEPERIDINE HCL 25 MG/ML IJ SOLN: 6.2500 mg | INTRAMUSCULAR | Status: DC | PRN

## 2014-12-21 MED ORDER — ALUM & MAG HYDROXIDE-SIMETH 200-200-20 MG/5ML PO SUSP: 30.0000 mL | ORAL | Status: DC | PRN

## 2014-12-21 MED ORDER — MIDAZOLAM HCL 2 MG/2ML IJ SOLN
INTRAMUSCULAR | Status: AC
  Filled 2014-12-21: qty 2

## 2014-12-21 MED ORDER — OXYCODONE-ACETAMINOPHEN 5-325 MG PO TABS: 1.0000 | ORAL_TABLET | ORAL | Status: DC | PRN

## 2014-12-21 MED ORDER — ZOLPIDEM TARTRATE 5 MG PO TABS: 5.0000 mg | ORAL_TABLET | Freq: Every evening | ORAL | Status: DC | PRN

## 2014-12-21 MED ORDER — SENNA 8.6 MG PO TABS
1.0000 | ORAL_TABLET | Freq: Two times a day (BID) | ORAL | Status: DC
  Administered 2014-12-21 – 2014-12-22 (×2): 8.6 mg via ORAL
  Filled 2014-12-21 (×4): qty 1

## 2014-12-21 MED ORDER — IBUPROFEN 600 MG PO TABS
600.0000 mg | ORAL_TABLET | Freq: Four times a day (QID) | ORAL | Status: DC | PRN
  Administered 2014-12-21 – 2014-12-22 (×2): 600 mg via ORAL
  Filled 2014-12-21 (×2): qty 1

## 2014-12-21 MED ORDER — NEOSTIGMINE METHYLSULFATE 10 MG/10ML IV SOLN
INTRAVENOUS | Status: AC
  Filled 2014-12-21: qty 1

## 2014-12-21 MED ORDER — LIDOCAINE HCL (CARDIAC) 20 MG/ML IV SOLN
INTRAVENOUS | Status: AC
  Filled 2014-12-21: qty 5

## 2014-12-21 MED ORDER — AMPHETAMINE-DEXTROAMPHETAMINE 10 MG PO TABS: 30.0000 mg | ORAL_TABLET | Freq: Every day | ORAL | Status: DC

## 2014-12-21 MED ORDER — METOCLOPRAMIDE HCL 5 MG/ML IJ SOLN: 10.0000 mg | Freq: Once | INTRAMUSCULAR | Status: DC | PRN

## 2014-12-21 MED ORDER — NEOSTIGMINE METHYLSULFATE 10 MG/10ML IV SOLN
INTRAVENOUS | Status: DC | PRN
  Administered 2014-12-21: 4 mg via INTRAVENOUS

## 2014-12-21 MED ORDER — LIDOCAINE HCL (CARDIAC) 20 MG/ML IV SOLN
INTRAVENOUS | Status: DC | PRN
  Administered 2014-12-21 (×2): 50 mg via INTRAVENOUS

## 2014-12-21 MED ORDER — DEXAMETHASONE SODIUM PHOSPHATE 10 MG/ML IJ SOLN
INTRAMUSCULAR | Status: AC
  Filled 2014-12-21: qty 1

## 2014-12-21 MED ORDER — CEFAZOLIN SODIUM-DEXTROSE 2-3 GM-% IV SOLR
INTRAVENOUS | Status: AC
  Filled 2014-12-21: qty 50

## 2014-12-21 MED ORDER — ALBUTEROL SULFATE (2.5 MG/3ML) 0.083% IN NEBU: 2.5000 mg | INHALATION_SOLUTION | Freq: Four times a day (QID) | RESPIRATORY_TRACT | Status: DC | PRN

## 2014-12-21 MED ORDER — HYDROMORPHONE HCL 1 MG/ML IJ SOLN
INTRAMUSCULAR | Status: AC
  Filled 2014-12-21: qty 1

## 2014-12-21 MED ORDER — ROCURONIUM BROMIDE 100 MG/10ML IV SOLN
INTRAVENOUS | Status: DC | PRN
  Administered 2014-12-21 (×2): 10 mg via INTRAVENOUS
  Administered 2014-12-21: 30 mg via INTRAVENOUS

## 2014-12-21 MED ORDER — GLYCOPYRROLATE 0.2 MG/ML IJ SOLN
INTRAMUSCULAR | Status: DC | PRN
  Administered 2014-12-21: .8 mg via INTRAVENOUS

## 2014-12-21 MED ORDER — ONDANSETRON HCL 4 MG/2ML IJ SOLN
INTRAMUSCULAR | Status: AC
  Filled 2014-12-21: qty 2

## 2014-12-21 MED ORDER — SCOPOLAMINE 1 MG/3DAYS TD PT72
1.0000 | MEDICATED_PATCH | Freq: Once | TRANSDERMAL | Status: DC
  Administered 2014-12-21: 1.5 mg via TRANSDERMAL

## 2014-12-21 MED ORDER — PROPOFOL 10 MG/ML IV BOLUS
INTRAVENOUS | Status: AC
  Filled 2014-12-21: qty 20

## 2014-12-21 MED ORDER — ONDANSETRON HCL 4 MG PO TABS: 4.0000 mg | ORAL_TABLET | Freq: Four times a day (QID) | ORAL | Status: DC | PRN

## 2014-12-21 MED ORDER — DEXAMETHASONE SODIUM PHOSPHATE 4 MG/ML IJ SOLN
INTRAMUSCULAR | Status: DC | PRN
  Administered 2014-12-21: 10 mg via INTRAVENOUS

## 2014-12-21 MED ORDER — MIDAZOLAM HCL 5 MG/5ML IJ SOLN
INTRAMUSCULAR | Status: DC | PRN
  Administered 2014-12-21: 2 mg via INTRAVENOUS

## 2014-12-21 MED ORDER — MONTELUKAST SODIUM 10 MG PO TABS
10.0000 mg | ORAL_TABLET | Freq: Every day | ORAL | Status: DC
  Administered 2014-12-21: 10 mg via ORAL
  Filled 2014-12-21: qty 1

## 2014-12-21 MED ORDER — ONDANSETRON HCL 4 MG/2ML IJ SOLN: 4.0000 mg | Freq: Four times a day (QID) | INTRAMUSCULAR | Status: DC | PRN

## 2014-12-21 MED ORDER — FENTANYL CITRATE 0.05 MG/ML IJ SOLN
INTRAMUSCULAR | Status: DC | PRN
  Administered 2014-12-21: 150 ug via INTRAVENOUS
  Administered 2014-12-21: 100 ug via INTRAVENOUS

## 2014-12-21 MED ORDER — CEFAZOLIN SODIUM-DEXTROSE 2-3 GM-% IV SOLR
2.0000 g | INTRAVENOUS | Status: AC
  Administered 2014-12-21: 2 g via INTRAVENOUS

## 2014-12-21 MED ORDER — CITALOPRAM HYDROBROMIDE 20 MG PO TABS
20.0000 mg | ORAL_TABLET | Freq: Every day | ORAL | Status: DC
  Administered 2014-12-22: 20 mg via ORAL
  Filled 2014-12-21 (×3): qty 1

## 2014-12-21 MED ORDER — HYDROMORPHONE HCL 1 MG/ML IJ SOLN
0.2500 mg | INTRAMUSCULAR | Status: DC | PRN
  Administered 2014-12-21: 0.5 mg via INTRAVENOUS

## 2014-12-21 MED ORDER — BUPIVACAINE HCL (PF) 0.5 % IJ SOLN
INTRAMUSCULAR | Status: AC
  Filled 2014-12-21: qty 30

## 2014-12-21 MED ORDER — BUDESONIDE-FORMOTEROL FUMARATE 160-4.5 MCG/ACT IN AERO
2.0000 | INHALATION_SPRAY | Freq: Two times a day (BID) | RESPIRATORY_TRACT | Status: DC
  Administered 2014-12-21 – 2014-12-22 (×2): 2 via RESPIRATORY_TRACT
  Filled 2014-12-21: qty 6

## 2014-12-21 SURGICAL SUPPLY — 50 items
BLADE SURG 10 STRL SS (BLADE) ×4 IMPLANT
BLADE SURG 11 STRL SS (BLADE) ×8 IMPLANT
CABLE HIGH FREQUENCY MONO STRZ (ELECTRODE) IMPLANT
CATH ROBINSON RED A/P 16FR (CATHETERS) ×4 IMPLANT
CLOSURE WOUND 1/4 X3 (GAUZE/BANDAGES/DRESSINGS)
CLOTH BEACON ORANGE TIMEOUT ST (SAFETY) ×4 IMPLANT
CONT PATH 16OZ SNAP LID 3702 (MISCELLANEOUS) ×4 IMPLANT
COVER BACK TABLE 60X90IN (DRAPES) ×4 IMPLANT
COVER MAYO STAND STRL (DRAPES) ×4 IMPLANT
DECANTER SPIKE VIAL GLASS SM (MISCELLANEOUS) ×4 IMPLANT
DERMABOND ADHESIVE PROPEN (GAUZE/BANDAGES/DRESSINGS) ×2
DERMABOND ADVANCED .7 DNX6 (GAUZE/BANDAGES/DRESSINGS) ×2 IMPLANT
DRSG COVADERM PLUS 2X2 (GAUZE/BANDAGES/DRESSINGS) ×4 IMPLANT
DRSG OPSITE POSTOP 3X4 (GAUZE/BANDAGES/DRESSINGS) ×4 IMPLANT
DURAPREP 26ML APPLICATOR (WOUND CARE) ×4 IMPLANT
ELECT LIGASURE LONG (ELECTRODE) IMPLANT
ELECT REM PT RETURN 9FT ADLT (ELECTROSURGICAL) ×4
ELECTRODE REM PT RTRN 9FT ADLT (ELECTROSURGICAL) ×2 IMPLANT
FILTER SMOKE EVAC LAPAROSHD (FILTER) ×4 IMPLANT
GLOVE BIO SURGEON STRL SZ7.5 (GLOVE) ×8 IMPLANT
GLOVE BIOGEL PI IND STRL 6.5 (GLOVE) ×2 IMPLANT
GLOVE BIOGEL PI IND STRL 8 (GLOVE) ×2 IMPLANT
GLOVE BIOGEL PI INDICATOR 6.5 (GLOVE) ×2
GLOVE BIOGEL PI INDICATOR 8 (GLOVE) ×2
GOWN STRL REUS W/ TWL LRG LVL3 (GOWN DISPOSABLE) ×14 IMPLANT
GOWN STRL REUS W/TWL LRG LVL3 (GOWN DISPOSABLE) ×14
LIQUID BAND (GAUZE/BANDAGES/DRESSINGS) ×4 IMPLANT
NEEDLE INSUFFLATION 120MM (ENDOMECHANICALS) ×4 IMPLANT
NS IRRIG 1000ML POUR BTL (IV SOLUTION) ×4 IMPLANT
PACK LAVH (CUSTOM PROCEDURE TRAY) ×4 IMPLANT
PAD POSITIONER PINK NONSTERILE (MISCELLANEOUS) ×4 IMPLANT
PROTECTOR NERVE ULNAR (MISCELLANEOUS) ×4 IMPLANT
SCISSORS LAP 5X45 EPIX DISP (ENDOMECHANICALS) IMPLANT
SEALER TISSUE G2 CVD JAW 45CM (ENDOMECHANICALS) ×4 IMPLANT
SET CYSTO W/LG BORE CLAMP LF (SET/KITS/TRAYS/PACK) IMPLANT
SET IRRIG TUBING LAPAROSCOPIC (IRRIGATION / IRRIGATOR) IMPLANT
STRIP CLOSURE SKIN 1/4X3 (GAUZE/BANDAGES/DRESSINGS) IMPLANT
SUT MNCRL 0 MO-4 VIOLET 18 CR (SUTURE) ×6 IMPLANT
SUT MNCRL 0 VIOLET 6X18 (SUTURE) ×2 IMPLANT
SUT MONOCRYL 0 6X18 (SUTURE) ×2
SUT MONOCRYL 0 MO 4 18  CR/8 (SUTURE) ×6
SUT VIC AB 4-0 PS2 27 (SUTURE) ×4 IMPLANT
SUT VICRYL 0 UR6 27IN ABS (SUTURE) IMPLANT
SYR 30ML LL (SYRINGE) ×4 IMPLANT
TOWEL OR 17X24 6PK STRL BLUE (TOWEL DISPOSABLE) ×8 IMPLANT
TRAY FOLEY CATH 14FR (SET/KITS/TRAYS/PACK) ×4 IMPLANT
TROCAR XCEL NON-BLD 11X100MML (ENDOMECHANICALS) ×4 IMPLANT
TROCAR XCEL NON-BLD 5MMX100MML (ENDOMECHANICALS) ×4 IMPLANT
WARMER LAPAROSCOPE (MISCELLANEOUS) ×4 IMPLANT
WATER STERILE IRR 1000ML POUR (IV SOLUTION) ×4 IMPLANT

## 2014-12-21 NOTE — Op Note (Signed)
NAMEJOCABED, CHEESE             ACCOUNT NO.:  [id number]  MEDICAL RECORD NO.:  [id number]  LOCATION:  [facility]                          FACILITY:  [facility]  PHYSICIAN:  Guy Sandifer. Henderson Cloud, M.D. DATE OF BIRTH:  01-22-1971  DATE OF PROCEDURE:  12/21/2014 DATE OF DISCHARGE:                              OPERATIVE REPORT   PREOPERATIVE DIAGNOSIS:  Menorrhagia.  POSTOPERATIVE DIAGNOSES: 1. Menorrhagia. 2. Endometriosis.  PROCEDURE:  Laparoscopically-assisted vaginal hysterectomy with bilateral salpingectomy.  SURGEON:  Guy Sandifer. Henderson Cloud, M.D.  ASSISTANTLindie Spruce Morris, DO  ANESTHESIA:  General with endotracheal intubation.  ESTIMATED BLOOD LOSS:  150 mL.  SPECIMENS:  Uterus and bilateral fallopian tubes to Pathology.  INDICATIONS AND CONSENT:  This patient is a 44 year old, multiparous patient, status post tubal ligation with heavy menses.  Details are dictated in the history and physical.  Laparoscopically-assisted vaginal hysterectomy, bilateral salpingectomy and removal of an ovary only if abnormal has been discussed preoperatively.  Potential risks and complications have been reviewed preoperatively including, but not limited to, infection, organ damage, bleeding requiring transfusion of blood products with HIV and hepatitis acquisition, DVT, PE, pneumonia, fistula formation, pelvic pain, abdominal pain, painful intercourse, laparotomy, return to the operating room.  The patient states she understands and agrees and all questions were answered.  Consent was signed on the chart.  FINDINGS:  There are some filmy omental adhesions to the anterior abdominal wall in the right upper quadrant,  status post cholecystectomy.  In the pelvis, the uterus is 6 weeks in size, smooth in contour.  Anterior cul-de-sac is normal.  Posterior cul-de-sac contains a single black implant at the endometriosis on the right uterosacral ligament.  Fallopian tubes were status post ligation bilaterally.   Ovaries were normal.  DESCRIPTION OF PROCEDURE:  The patient was taken to the operating room, where she was identified, placed in dorsal supine position and general anesthesia was induced to the endotracheal intubation.  She was placed in the dorsal lithotomy position.  Time-out was undertaken.  She was prepped abdominally and vaginally.  Bladder straight catheterized. Hulka tenaculum was placed at the uterus as a manipulator and she was draped in a sterile fashion.  The infraumbilical and suprapubic areas were then injected in the midline with approximately 5 mL of 1.5% plain Marcaine.  A small infraumbilical incision was made.  Disposable Veress needle was placed.  A good syringe and drop test were noted.  A 2 L of gas were then insufflated under low pressure with good tympany in the right upper quadrant.  Veress needle was removed.  Then, using the XL 10- 11 mm disposable trocar sleeve was placed with direct visualization using the diagnostic laparoscope.  After placement, the operative scope was used.  A small suprapubic incision was made and a 5-mm disposable trocar sleeve was placed under direct visualization without difficulty. The above findings were noted.  Then, using the EnSeal bipolar cautery cutting instrument,  the right fallopian tube was taken down along the mesosalpinx down the level of the uterus.  The utero-ovarian and round ligaments were taken down and this was carried down the broad ligament to the level of the vesicouterine peritoneum.  Similar procedure was carried out  on the left side.  Vesicouterine peritoneum was taken down cephalolaterally.  Good hemostasis was noted.  Suprapubic trocar sleeve was removed.  The instruments are removed and attention was turned to the vagina.  Posterior cul-de-sacs entered sharply and the cervix was circumscribed with unipolar cautery.  Mucosa was advanced sharply and bluntly.  Progressive bites with the handheld LigaSure bipolar  cautery taken of the uterosacral ligaments, cardinal ligaments, bladder pillars and uterine vessels bilaterally.  The anterior cul-de-sac was entered without difficulty.  Fundus was delivered posteriorly and the remaining pedicles were taken down.  All suture will be 0 Monocryl unless otherwise designated.  Uterosacral ligaments were plicated with the cuff bilaterally and then plicated in the midline with a third suture.  Cuff was closed with figure-of-eights.  Foley catheter was placed in the bladder and clear urine was noted.  Attention was returned to the abdomen.  Pneumoperitoneum was reintroduced and the suprapubic trocar sleeve was reintroduced under direct visualization.  Irrigation was carried out and inspection under reduced pneumoperitoneum reveals good hemostasis all around.  The remaining approximately 26 mL of 1.5% plain Marcaine was instilled into the peritoneal cavity.  Suprapubic trocar sleeve was removed.  Pneumoperitoneum was reduced.  The umbilical trocar sleeve was removed.  The umbilical incision was closed with a 0 Vicryl and the subcutaneous tissues.  Under good visualization, the skin was closed with interrupted 2-0 Vicryl on both incisions.  Dermabond was placed on both.  All counts were correct.  The patient was awakened and taken to recovery room in stable condition.     Guy SandiferJames E. Henderson Cloudomblin, M.D.     JET/MEDQ  D:  12/21/2014  T:  12/21/2014  Job:  [id number]

## 2014-12-21 NOTE — Transfer of Care (Signed)
Immediate Anesthesia Transfer of Care Note  Patient: Patricia Bender  Procedure(s) Performed: Procedure(s): LAPAROSCOPIC ASSISTED VAGINAL HYSTERECTOMY BILATERAL SALPINGECTOMY  (N/A) BILATERAL SALPINGECTOMY (Bilateral)  Patient Location: PACU  Anesthesia Type:General  Level of Consciousness: awake, oriented and patient cooperative  Airway & Oxygen Therapy: Patient Spontanous Breathing and Patient connected to nasal cannula oxygen  Post-op Assessment: Report given to PACU RN and Post -op Vital signs reviewed and stable  Post vital signs: Reviewed and stable  Complications: No apparent anesthesia complications

## 2014-12-21 NOTE — Anesthesia Postprocedure Evaluation (Signed)
  Anesthesia Post-op Note  Patient: Patricia Bender  Procedure(s) Performed: Procedure(s): LAPAROSCOPIC ASSISTED VAGINAL HYSTERECTOMY BILATERAL SALPINGECTOMY  (N/A) BILATERAL SALPINGECTOMY (Bilateral)  Patient Location: [facility]  Anesthesia Type:General  Level of Consciousness: awake, alert , oriented and patient cooperative  Airway and Oxygen Therapy: Patient Spontanous Breathing  Post-op Pain: mild  Post-op Assessment: Post-op Vital signs reviewed, Patient's Cardiovascular Status Stable, Respiratory Function Stable, Patent Airway and No signs of Nausea or vomiting  Post-op Vital Signs: Reviewed and stable  Last Vitals:  Filed Vitals:   12/21/14 1200  BP: 115/65  Pulse: 57  Temp: 36.8 C  Resp: 18    Complications: No apparent anesthesia complications

## 2014-12-21 NOTE — Progress Notes (Signed)
No changes to H&P per patient history Reviewed with patient procedure-LAVH/bilat salpingectomy All questions answered Patient states she understands and agrees

## 2014-12-21 NOTE — Addendum Note (Signed)
Addendum  created 12/21/14 1227 by Yolonda KidaAlison L Anjana Cheek, CRNA   Modules edited: Notes Section   Notes Section:  File: [id number]

## 2014-12-21 NOTE — Anesthesia Procedure Notes (Signed)
Procedure Name: Intubation Date/Time: 12/21/2014 7:45 AM Performed by: Janeece AgeeWRAPE, Dalyce Renne W Pre-anesthesia Checklist: Patient identified, Emergency Drugs available, Suction available, Patient being monitored and Timeout performed Patient Re-evaluated:Patient Re-evaluated prior to inductionOxygen Delivery Method: Circle system utilized Preoxygenation: Pre-oxygenation with 100% oxygen Intubation Type: IV induction Ventilation: Mask ventilation without difficulty Laryngoscope Size: Mac and 3 Grade View: Grade II Tube type: Oral Tube size: 7.0 mm Number of attempts: 1 Airway Equipment and Method: Stylet and Patient positioned with wedge pillow Placement Confirmation: ETT inserted through vocal cords under direct vision,  positive ETCO2 and breath sounds checked- equal and bilateral Secured at: 22 cm Tube secured with: Tape Dental Injury: Teeth and Oropharynx as per pre-operative assessment

## 2014-12-21 NOTE — Brief Op Note (Signed)
12/21/2014  9:03 AM  PATIENT:  Patricia Bender  44 y.o. female  PRE-OPERATIVE DIAGNOSIS:  menorrhagia  POST-OPERATIVE DIAGNOSIS:  menorrhagia, endometreosis  PROCEDURE:  Procedure(s): LAPAROSCOPIC ASSISTED VAGINAL HYSTERECTOMY BILATERAL SALPINGECTOMY  (N/A) BILATERAL SALPINGECTOMY (Bilateral)  SURGEON:  Surgeon(s) and Role:    * Leslie AndreaJames E Rhen Kawecki II, MD - Primary    * Mitchel HonourMegan Morris, DO - Assisting  PHYSICIAN ASSISTANT:   ASSISTANTS: Morris   ANESTHESIA:   general  EBL:  Total I/O In: 1000 [I.V.:1000] Out: 250 [Urine:100; Blood:150]  BLOOD ADMINISTERED:none  DRAINS: Urinary Catheter (Foley)   LOCAL MEDICATIONS USED:  OTHER Exparel 20ml in Saline 20ml  SPECIMEN:  Source of Specimen:  placenta and bilat tubes  DISPOSITION OF SPECIMEN:  PATHOLOGY  COUNTS:  YES  TOURNIQUET:  * No tourniquets in log *  DICTATION: .Other Dictation: Dictation Number [id number]  PLAN OF CARE: Admit for overnight observation  PATIENT DISPOSITION:  PACU - hemodynamically stable.   Delay start of Pharmacological VTE agent (>24hrs) due to surgical blood loss or risk of bleeding: not applicable

## 2014-12-21 NOTE — Anesthesia Postprocedure Evaluation (Signed)
Anesthesia Post Note  Patient: Patricia Bender  Procedure(s) Performed: Procedure(s) (LRB): LAPAROSCOPIC ASSISTED VAGINAL HYSTERECTOMY BILATERAL SALPINGECTOMY  (N/A) BILATERAL SALPINGECTOMY (Bilateral)  Anesthesia type: General  Patient location: PACU  Post pain: Pain level controlled  Post assessment: Post-op Vital signs reviewed  Last Vitals:  Filed Vitals:   12/21/14 1015  BP: 94/47  Pulse: 70  Temp:   Resp: 16    Post vital signs: Reviewed  Level of consciousness: sedated  Complications: No apparent anesthesia complications

## 2014-12-22 ENCOUNTER — Encounter (HOSPITAL_COMMUNITY): Payer: Self-pay | Admitting: Obstetrics and Gynecology

## 2014-12-22 DIAGNOSIS — N920 Excessive and frequent menstruation with regular cycle: Secondary | ICD-10-CM | POA: Diagnosis not present

## 2014-12-22 LAB — CBC
HEMATOCRIT: 35.8 % — AB (ref 36.0–46.0)
Hemoglobin: 11.8 g/dL — ABNORMAL LOW (ref 12.0–15.0)
MCH: 27.2 pg (ref 26.0–34.0)
MCHC: 33 g/dL (ref 30.0–36.0)
MCV: 82.5 fL (ref 78.0–100.0)
PLATELETS: 269 10*3/uL (ref 150–400)
RBC: 4.34 MIL/uL (ref 3.87–5.11)
RDW: 15.1 % (ref 11.5–15.5)
WBC: 25.2 10*3/uL — AB (ref 4.0–10.5)

## 2014-12-22 MED ORDER — OXYCODONE-ACETAMINOPHEN 5-325 MG PO TABS: 1.0000 | ORAL_TABLET | Freq: Four times a day (QID) | ORAL | Status: DC | PRN

## 2014-12-22 MED ORDER — IBUPROFEN 600 MG PO TABS: 600.0000 mg | ORAL_TABLET | Freq: Four times a day (QID) | ORAL | Status: DC | PRN

## 2014-12-22 NOTE — Progress Notes (Signed)
Good pain relief, ambulating well, tolerating regular diet, + flatus, voiding  VSS Afeb Abd soft, +BS Incisions C&D  Results for orders placed or performed during the hospital encounter of 12/21/14 (from the past 24 hour(s))  CBC     Status: Abnormal   Collection Time: 12/22/14  5:20 AM  Result Value Ref Range   WBC 25.2 (H) 4.0 - 10.5 K/uL   RBC 4.34 3.87 - 5.11 MIL/uL   Hemoglobin 11.8 (L) 12.0 - 15.0 g/dL   HCT 57.835.8 (L) 46.936.0 - 62.946.0 %   MCV 82.5 78.0 - 100.0 fL   MCH 27.2 26.0 - 34.0 pg   MCHC 33.0 30.0 - 36.0 g/dL   RDW 52.815.1 41.311.5 - 24.415.5 %   Platelets 269 150 - 400 K/uL    A: Satisfactory  P: D/C home

## 2014-12-22 NOTE — Progress Notes (Signed)
Ur chart review completed.  

## 2014-12-22 NOTE — Discharge Summary (Signed)
Physician Discharge Summary  Patient ID: Patricia Bender MRN: [id number] DOB/AGE: Oct 29, 1971 44 y.o.  Admit date: 12/21/2014 Discharge date: 12/22/2014  Admission Diagnoses:menorrhagia  Discharge Diagnoses:  Active Problems:   Menorrhagia   Discharged Condition: good  Hospital Course: good resumption of bowel and bladder function, ambulating well, tolerating regular diet, good pain relief  Consults: None  Significant Diagnostic Studies: labs:  Results for orders placed or performed during the hospital encounter of 12/21/14 (from the past 24 hour(s))  CBC     Status: Abnormal   Collection Time: 12/22/14  5:20 AM  Result Value Ref Range   WBC 25.2 (H) 4.0 - 10.5 K/uL   RBC 4.34 3.87 - 5.11 MIL/uL   Hemoglobin 11.8 (L) 12.0 - 15.0 g/dL   HCT 82.935.8 (L) 56.236.0 - 13.046.0 %   MCV 82.5 78.0 - 100.0 fL   MCH 27.2 26.0 - 34.0 pg   MCHC 33.0 30.0 - 36.0 g/dL   RDW 86.515.1 78.411.5 - 69.615.5 %   Platelets 269 150 - 400 K/uL     Treatments: surgery: LAVH/Bilat salpingectomy  Discharge Exam: Blood pressure 115/66, pulse 63, temperature 98.2 F (36.8 C), temperature source Oral, resp. rate 18, height 5\' 5"  (1.651 m), weight 256 lb (116.121 kg), SpO2 99 %. General appearance: alert, cooperative and no distress GI: soft, non-tender; bowel sounds normal; no masses,  no organomegaly  Disposition: 01-Home or Self Care     Medication List    TAKE these medications        albuterol 108 (90 BASE) MCG/ACT inhaler  Commonly known as:  PROVENTIL HFA;VENTOLIN HFA  Inhale 2 puffs into the lungs every 6 (six) hours as needed for wheezing or shortness of breath.     amphetamine-dextroamphetamine 30 MG tablet  Commonly known as:  ADDERALL  Take 30 mg by mouth daily.     budesonide-formoterol 160-4.5 MCG/ACT inhaler  Commonly known as:  SYMBICORT  Inhale 2 puffs into the lungs 2 (two) times daily.     citalopram 20 MG tablet  Commonly known as:  CELEXA  Take 20 mg by mouth daily.     ibuprofen  600 MG tablet  Commonly known as:  ADVIL,MOTRIN  Take 1 tablet (600 mg total) by mouth every 6 (six) hours as needed (mild pain).     montelukast 10 MG tablet  Commonly known as:  SINGULAIR  TAKE 1 TABLET BY MOUTH ONCE DAILY AT BEDTIME     oxyCODONE-acetaminophen 5-325 MG per tablet  Commonly known as:  PERCOCET/ROXICET  Take 1-2 tablets by mouth every 6 (six) hours as needed (moderate to severe pain (when tolerating fluids)).         Signed: Retta MacMBLIN II,Courtney Bellizzi E 12/22/2014, 10:31 AM

## 2014-12-22 NOTE — Progress Notes (Signed)
Discharge instructions reviewed with patient.  Patient states understanding of home care, medications, activity, signs/symptoms to report to MD and return MD office visit.  Patients significant family will assist with her care @ home.  No home equipment needed, prescriptions given to patient and patient has all personal belongings.  Patient ambulated for discharge in stable condition with staff without incident.

## 2015-01-13 ENCOUNTER — Encounter: Payer: Self-pay | Admitting: Adult Health

## 2015-01-13 ENCOUNTER — Ambulatory Visit (INDEPENDENT_AMBULATORY_CARE_PROVIDER_SITE_OTHER): Payer: 59 | Admitting: Adult Health

## 2015-01-13 VITALS — BP 124/80 | HR 89 | Temp 98.3°F | Ht 65.0 in | Wt 260.6 lb

## 2015-01-13 DIAGNOSIS — J209 Acute bronchitis, unspecified: Secondary | ICD-10-CM

## 2015-01-13 DIAGNOSIS — J683 Other acute and subacute respiratory conditions due to chemicals, gases, fumes and vapors: Secondary | ICD-10-CM

## 2015-01-13 DIAGNOSIS — J4531 Mild persistent asthma with (acute) exacerbation: Secondary | ICD-10-CM

## 2015-01-13 MED ORDER — AMOXICILLIN-POT CLAVULANATE 875-125 MG PO TABS: 1.0000 | ORAL_TABLET | Freq: Two times a day (BID) | ORAL | Status: AC

## 2015-01-13 MED ORDER — LEVALBUTEROL HCL 0.63 MG/3ML IN NEBU
0.6300 mg | INHALATION_SOLUTION | Freq: Once | RESPIRATORY_TRACT | Status: AC
  Administered 2015-01-13: 0.63 mg via RESPIRATORY_TRACT

## 2015-01-13 NOTE — Progress Notes (Signed)
   Subjective:    Patient ID: Patricia NiemannAmanda J Voong, female    DOB: 11/21/1971, 44 y.o.   MRN: [id number]  HPI  Gyn - Huntley Decomlin  44 year old smoker for FU of reactive airways. She reports smoking half pack per day since the age of 44. 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 She has 4 children youngest aged 42.  Her PCP Dr. Ermalene [facility]earingBedsole at [facility] [place] had noted reactive airway disease, triggers being allergies and nasal congestion. She was maintained on Singulair, which he takes every year from May until August and reports compliance.  She also has ADHD and anxiety  Significant tests/ events  04/2014 She developed hypoxia after insufflation of the abdomen for laparoscopy. This improved once the pneumoperitoneum was decreased. The operation was aborted and we were consulted.  Her last procedure was laparoscopic cholecystectomy in 2003 which was uneventful.  spirometry  05/14/2013 shows no evidence of airway obstruction with FEV1 of 91%.  Spirometry 04/2014 - FEv1 84%, FVC 91%, ratio 77 IgE 19, Rast low grade post to dust/grass    SInus infection--> chest cold -> took augmentin & pred taper Still has green phlegm, wheezing better C/o puffiness of hands & feet TSH 3.1 2012 Had cardiac eval 10 y ago Continues to smoke - only way to keep wt in check   She was started on Symbicort 04/2014-she is  not sure this is helping    Has Hysterectomy scheduled for Jan   01/13/2015 Follow up : RAD/smoker and AR RA pt here for 3 month follow up .  Does report prod cough with green mucus, wheezing, increased SOB, tightness x1 week.   Denies any f/c/s, n/v/d, hemoptysis Remains on singulair and Symbicort.  Has been using Claritin without much help. Started on Mucinex yesterday. Last antibiotic greater than 3 months ago. We discussed smoking cessation.      Past Medical History  Diagnosis Date  . SVD (spontaneous vaginal delivery)     x 4  . ADHD (attention deficit hyperactivity  disorder)   . Seasonal allergies   . Anxiety   . Depression   . Anemia     History      Review of Systems neg for any significant sore throat, dysphagia, itching, sneezing, nasal congestion or excess/ purulent secretions, fever, chills, sweats, unintended wt loss, pleuritic or exertional cp, hempoptysis, orthopnea pnd or change in chronic leg swelling. Also denies presyncope, palpitations, heartburn, abdominal pain, nausea, vomiting, diarrhea or change in bowel or urinary habits, dysuria,hematuria, rash, arthralgias, visual complaints, headache, numbness weakness or ataxia.     Objective:   Physical Exam  Gen. Pleasant, obese, in no distress ENT - no lesions, clear nasal discharge Neck: No JVD, no thyromegaly, no carotid bruits Lungs: no use of accessory muscles, no dullness to percussion, few exp wheezes  Cardiovascular: Rhythm regular, heart sounds  normal, no murmurs or gallops, no peripheral edema Musculoskeletal: No deformities, no cyanosis or clubbing , no tremors        Assessment & Plan:

## 2015-01-13 NOTE — Assessment & Plan Note (Signed)
RAD in pt with ongoing smoking -tendency toward flare  Xopenex nebulizer treatment in office  Plan  Continue to work on smoking cessation Follow up Dr. Vassie LollAlva  In 4 months  Continue to work on not smoking

## 2015-01-13 NOTE — Addendum Note (Signed)
Addended by: Boone MasterJONES, Chidi Shirer E on: 01/13/2015 09:31 AM   Modules accepted: Orders

## 2015-01-13 NOTE — Assessment & Plan Note (Signed)
Augmentin 875 mg twice daily for 7 days.-Take with food Begin probiotic daily while on antibiotics. Mucinex DM twice daily as needed for cough and congestion Fluids and rest  Please contact office for sooner follow up if symptoms do not improve or worsen or seek emergency care  Follow up Dr. Alva  In 4 months  Continue to work on not smoking.  

## 2015-01-13 NOTE — Patient Instructions (Signed)
Augmentin 875 mg twice daily for 7 days.-Take with food Begin probiotic daily while on antibiotics. Mucinex DM twice daily as needed for cough and congestion Fluids and rest  Please contact office for sooner follow up if symptoms do not improve or worsen or seek emergency care  Follow up Dr. Vassie LollAlva  In 4 months  Continue to work on not smoking.

## 2015-01-13 NOTE — Progress Notes (Signed)
Reviewed & agree with plan  

## 2015-01-28 ENCOUNTER — Encounter (HOSPITAL_COMMUNITY): Payer: Self-pay

## 2015-01-28 ENCOUNTER — Emergency Department (HOSPITAL_COMMUNITY)
Admission: EM | Admit: 2015-01-28 | Discharge: 2015-01-28 | Disposition: A | Discharge status: Home / Self Care | Payer: 59 | Attending: Emergency Medicine | Admitting: Emergency Medicine

## 2015-01-28 ENCOUNTER — Emergency Department (HOSPITAL_COMMUNITY): Payer: 59

## 2015-01-28 DIAGNOSIS — Z720 Tobacco use: Secondary | ICD-10-CM | POA: Insufficient documentation

## 2015-01-28 DIAGNOSIS — W108XXA Fall (on) (from) other stairs and steps, initial encounter: Secondary | ICD-10-CM | POA: Insufficient documentation

## 2015-01-28 DIAGNOSIS — Y998 Other external cause status: Secondary | ICD-10-CM | POA: Diagnosis not present

## 2015-01-28 DIAGNOSIS — F329 Major depressive disorder, single episode, unspecified: Secondary | ICD-10-CM | POA: Insufficient documentation

## 2015-01-28 DIAGNOSIS — Y92009 Unspecified place in unspecified non-institutional (private) residence as the place of occurrence of the external cause: Secondary | ICD-10-CM | POA: Diagnosis not present

## 2015-01-28 DIAGNOSIS — F419 Anxiety disorder, unspecified: Secondary | ICD-10-CM | POA: Insufficient documentation

## 2015-01-28 DIAGNOSIS — Z7951 Long term (current) use of inhaled steroids: Secondary | ICD-10-CM | POA: Insufficient documentation

## 2015-01-28 DIAGNOSIS — F909 Attention-deficit hyperactivity disorder, unspecified type: Secondary | ICD-10-CM | POA: Diagnosis not present

## 2015-01-28 DIAGNOSIS — Z862 Personal history of diseases of the blood and blood-forming organs and certain disorders involving the immune mechanism: Secondary | ICD-10-CM | POA: Diagnosis not present

## 2015-01-28 DIAGNOSIS — S99921A Unspecified injury of right foot, initial encounter: Secondary | ICD-10-CM | POA: Diagnosis present

## 2015-01-28 DIAGNOSIS — Y9389 Activity, other specified: Secondary | ICD-10-CM | POA: Diagnosis not present

## 2015-01-28 DIAGNOSIS — W010XXA Fall on same level from slipping, tripping and stumbling without subsequent striking against object, initial encounter: Secondary | ICD-10-CM

## 2015-01-28 DIAGNOSIS — Z79899 Other long term (current) drug therapy: Secondary | ICD-10-CM | POA: Diagnosis not present

## 2015-01-28 DIAGNOSIS — S92151A Displaced avulsion fracture (chip fracture) of right talus, initial encounter for closed fracture: Secondary | ICD-10-CM | POA: Diagnosis not present

## 2015-01-28 MED ORDER — OXYCODONE-ACETAMINOPHEN 5-325 MG PO TABS: 1.0000 | ORAL_TABLET | Freq: Four times a day (QID) | ORAL | Status: DC | PRN

## 2015-01-28 MED ORDER — IBUPROFEN 600 MG PO TABS: 600.0000 mg | ORAL_TABLET | Freq: Four times a day (QID) | ORAL | Status: DC | PRN

## 2015-01-28 NOTE — ED Provider Notes (Signed)
CSN: [id number]     Arrival date & time 01/28/15  0604 History   First MD Initiated Contact with Patient 01/28/15 [phone]     Chief Complaint  Patient presents with  . Foot Injury     (Consider location/radiation/quality/duration/timing/severity/associated sxs/prior Treatment) HPI Pt is a 44yo female presenting to ED with c/o gradually worsening right foot pain that started after she tripped and fell down steps at home last night.  Pt denies hitting her head or any other injuries.  Right foot pain is aching, sore, 8/10 at worst.  Pain worsened after she went to work around 0230 AM.  She is unable to tolerate weight bearing. No pain medication PTA. No previous fractures or surgeries to same foot.  Reports mild numbness.   Past Medical History  Diagnosis Date  . SVD (spontaneous vaginal delivery)     x 4  . ADHD (attention deficit hyperactivity disorder)   . Seasonal allergies   . Anxiety   . Depression   . Anemia     History   Past Surgical History  Procedure Laterality Date  . Cholecystectomy  03/2011  . Tubal ligation    . Wisdom tooth extraction    . Laparoscopy N/A 05/19/2014    Procedure: LAPAROSCOPY DIAGNOSTIC;  Surgeon: Leslie AndreaJames E Tomblin II, MD;  Location: [facility];  Service: Gynecology;  Laterality: N/A;  . Laparoscopic assisted vaginal hysterectomy N/A 12/21/2014    Procedure: LAPAROSCOPIC ASSISTED VAGINAL HYSTERECTOMY BILATERAL SALPINGECTOMY ;  Surgeon: Leslie AndreaJames E Tomblin II, MD;  Location: [facility];  Service: Gynecology;  Laterality: N/A;  . Bilateral salpingectomy Bilateral 12/21/2014    Procedure: BILATERAL SALPINGECTOMY;  Surgeon: Leslie AndreaJames E Tomblin II, MD;  Location: [facility];  Service: Gynecology;  Laterality: Bilateral;   History reviewed. No pertinent family history. History  Substance Use Topics  . Smoking status: Current Every Day Smoker -- 0.50 packs/day for 21 years    Types: Cigarettes  . Smokeless tobacco: Never Used  . Alcohol Use: No   OB History    No data available      Review of Systems  Musculoskeletal: Positive for myalgias, joint swelling and arthralgias.       Right foot  Skin: Negative for color change and wound.  Neurological: Positive for numbness ( mild in right foot). Negative for weakness.  All other systems reviewed and are negative.     Allergies  Review of patient's allergies indicates no known allergies.  Home Medications   Prior to Admission medications   Medication Sig Start Date End Date Taking? Authorizing Provider  albuterol (PROVENTIL HFA;VENTOLIN HFA) 108 (90 BASE) MCG/ACT inhaler Inhale 2 puffs into the lungs every 6 (six) hours as needed for wheezing or shortness of breath. 05/19/14  Yes Oretha Milchakesh Alva V, MD  amphetamine-dextroamphetamine (ADDERALL XR) 30 MG 24 hr capsule Take 30 mg by mouth daily.   Yes Historical Provider, MD  budesonide-formoterol (SYMBICORT) 160-4.5 MCG/ACT inhaler Inhale 2 puffs into the lungs 2 (two) times daily. 06/23/14  Yes Tammy S Parrett, NP  citalopram (CELEXA) 20 MG tablet Take 20 mg by mouth daily.   Yes Historical Provider, MD  montelukast (SINGULAIR) 10 MG tablet TAKE 1 TABLET BY MOUTH ONCE DAILY AT BEDTIME Patient taking differently: Take 10 mg by mouth at bedtime.  12/07/14  Yes Amy Michelle NasutiE Bedsole, MD  ibuprofen (ADVIL,MOTRIN) 600 MG tablet Take 1 tablet (600 mg total) by mouth every 6 (six) hours as needed (mild pain). 01/28/15   Junius FinnerErin O'Malley, PA-C  oxyCODONE-acetaminophen (  PERCOCET/ROXICET) 5-325 MG per tablet Take 1-2 tablets by mouth every 6 (six) hours as needed (moderate to severe pain (when tolerating fluids)). 01/28/15   Junius Finner, PA-C   BP 132/79 mmHg  Pulse 100  Temp(Src) 98.2 F (36.8 C) (Oral)  Resp 18  Ht  (1.753 m)  Wt 259 lb (117.482 kg)  BMI 38.23 kg/m2  SpO2 99%  LMP 03/27/2014 Physical Exam  Constitutional: She appears well-developed and well-nourished. No distress.  HENT:  Head: Normocephalic and atraumatic.  Eyes: Conjunctivae are normal. No scleral icterus.   Neck: Normal range of motion.  Cardiovascular: Normal rate and regular rhythm.   Pulses:      Dorsalis pedis pulses are 2+ on the right side.       Posterior tibial pulses are 2+ on the right side.  Pulmonary/Chest: Effort normal. No respiratory distress.  Musculoskeletal: Normal range of motion. She exhibits edema and tenderness.  Right foot: mild edema to lateral aspect with tenderness. FROM with 4/5 strength with plantarflexion and dorsiflexion. compared to left due to pain.  Unable to bear weight.   Neurological: She is alert.  Right foot: slight decrease in light touch   Skin: Skin is warm and dry. She is not diaphoretic. No erythema.  Right foot: skin in tact. No ecchymosis or erythema.   Psychiatric: She has a normal mood and affect. Her behavior is normal.  Nursing note and vitals reviewed.   ED Course  Procedures (including critical care time) Labs Review Labs Reviewed - No data to display  Imaging Review Dg Foot Complete Right  01/28/2015   CLINICAL DATA:  Patient fell down steps.  Midfoot pain currently  EXAM: RIGHT FOOT COMPLETE - 3+ VIEW  COMPARISON:  June 16, 2006  FINDINGS: Frontal, oblique, and lateral views were obtained. There is a small avulsion arising from the dorsal distal talus. No other apparent fracture. No dislocation. Joint spaces appear intact. No erosive change.  IMPRESSION: Small avulsion arising from the dorsal distal talus. No other fracture. No dislocation. No appreciable arthropathy.   Electronically Signed   By: Bretta Bang III M.D.   On: 01/28/2015 07:06     EKG Interpretation None      MDM   Final diagnoses:  Avulsion fracture of talus, right, closed, initial encounter  Fall from slip, trip, or stumble, initial encounter   Pt presenting to ED with right foot pain after mechanical fall last night at home.  No other injuries. Right foot is neurovascularly in tact.  Plain films: small avulsion arising from dorsal distal talus. No other  fracture.  No dislocation.  Will placed pt in Cam Walker boot, crutches provided for comfort. Rx: percocet and ibuprofen. Home care instructions provided. Advised to call to schedule f/u appointment with Dr. Ave Filter, orthopedist, for further evaluation and treatment. Pt verbalized understanding and agreement with tx plan.    Junius Finner, PA-C 01/28/15 1610  Ward Givens, MD 01/28/15 [phone]

## 2015-01-28 NOTE — Discharge Instructions (Signed)
You have been diagnosed with an avulsion fracture of your talus bone in right foot.  You should keep your foot elevated when possible and use ice 3 to 4 times a day for 15 to 20 minutes at a time to help with pain and swelling.  Your injury may take 4-6 weeks to heal completely. Be sure to call to schedule a follow up appointment with orthopedist for further evaluation and more detailed treatment.

## 2015-01-28 NOTE — ED Notes (Signed)
Patient reports she fell down steps at her home last night.  Denies dizziness.  She went to work and noticed pain was worse around 0230 today.  She reports she cannot tolerate weight bearing.

## 2015-11-29 ENCOUNTER — Ambulatory Visit (INDEPENDENT_AMBULATORY_CARE_PROVIDER_SITE_OTHER): Payer: 59 | Admitting: Primary Care

## 2015-11-29 ENCOUNTER — Encounter: Payer: Self-pay | Admitting: Primary Care

## 2015-11-29 VITALS — BP 110/74 | HR 105 | Temp 97.5°F | Ht 65.0 in | Wt 255.0 lb

## 2015-11-29 DIAGNOSIS — R062 Wheezing: Secondary | ICD-10-CM

## 2015-11-29 DIAGNOSIS — R059 Cough, unspecified: Secondary | ICD-10-CM

## 2015-11-29 DIAGNOSIS — R05 Cough: Secondary | ICD-10-CM

## 2015-11-29 MED ORDER — ALBUTEROL SULFATE HFA 108 (90 BASE) MCG/ACT IN AERS: 2.0000 | INHALATION_SPRAY | Freq: Four times a day (QID) | RESPIRATORY_TRACT | Status: DC | PRN

## 2015-11-29 MED ORDER — AZITHROMYCIN 250 MG PO TABS: ORAL_TABLET | ORAL | Status: DC

## 2015-11-29 MED ORDER — PREDNISONE 20 MG PO TABS
ORAL_TABLET | ORAL | Status: DC
Start: 2015-11-29 — End: 2016-07-14

## 2015-11-29 NOTE — Progress Notes (Signed)
Pre visit review using our clinic review tool, if applicable. No additional management support is needed unless otherwise documented below in the visit note. 

## 2015-11-29 NOTE — Progress Notes (Signed)
Subjective:    Patient ID: Patricia Bender, female    DOB: 1971-05-04, 45 y.o.   MRN: [id number]  HPI  Patricia Bender is a 45 year old female who presents today with a chief complaint of cough. She also reports nasal congestion, shortness of breath, wheezing, fatigue, sinus pressure. She is currently managed on Singulair, Symbicort, and albuterol. Her symptoms began 3 days ago. Her cough is productive with green/yellow sputum, and she's blowing yellow/green mucous from her nose. She's taken Claritin without relief. Denies fevers, chills. She is out of her albuterol inhaler.   Review of Systems  Constitutional: Negative for fever and chills.  HENT: Positive for congestion, ear pain, sinus pressure and sore throat.   Respiratory: Positive for cough and shortness of breath.   Cardiovascular: Negative for chest pain.  Neurological: Positive for headaches.       Past Medical History  Diagnosis Date  . SVD (spontaneous vaginal delivery)     x 4  . ADHD (attention deficit hyperactivity disorder)   . Seasonal allergies   . Anxiety   . Depression   . Anemia     History    Social History   Social History  . Marital Status: Married    Spouse Name: N/A  . Number of Children: 4  . Years of Education: N/A   Occupational History  . [employer] lab    Social History Main Topics  . Smoking status: Current Every Day Smoker -- 0.50 packs/day for 21 years    Types: Cigarettes  . Smokeless tobacco: Never Used  . Alcohol Use: No  . Drug Use: No  . Sexual Activity: Yes    Birth Control/ Protection: Surgical   Other Topics Concern  . Not on file   Social History Narrative   Works in Pathology at [employer]    Past Surgical History  Procedure Laterality Date  . Cholecystectomy  03/2011  . Tubal ligation    . Wisdom tooth extraction    . Laparoscopy N/A 05/19/2014    Procedure: LAPAROSCOPY DIAGNOSTIC;  Surgeon: Leslie Andrea, MD;  Location: [facility];  Service: Gynecology;  Laterality: N/A;    . Laparoscopic assisted vaginal hysterectomy N/A 12/21/2014    Procedure: LAPAROSCOPIC ASSISTED VAGINAL HYSTERECTOMY BILATERAL SALPINGECTOMY ;  Surgeon: Leslie Andrea, MD;  Location: [facility];  Service: Gynecology;  Laterality: N/A;  . Bilateral salpingectomy Bilateral 12/21/2014    Procedure: BILATERAL SALPINGECTOMY;  Surgeon: Leslie Andrea, MD;  Location: [facility];  Service: Gynecology;  Laterality: Bilateral;    No family history on file.  No Known Allergies  Current Outpatient Prescriptions on File Prior to Visit  Medication Sig Dispense Refill  . amphetamine-dextroamphetamine (ADDERALL XR) 30 MG 24 hr capsule Take 30 mg by mouth daily.    . budesonide-formoterol (SYMBICORT) 160-4.5 MCG/ACT inhaler Inhale 2 puffs into the lungs 2 (two) times daily. 10.2 g 6  . citalopram (CELEXA) 20 MG tablet Take 20 mg by mouth daily.    . montelukast (SINGULAIR) 10 MG tablet TAKE 1 TABLET BY MOUTH ONCE DAILY AT BEDTIME (Patient taking differently: Take 10 mg by mouth at bedtime. ) 90 tablet 3   No current facility-administered medications on file prior to visit.    BP 110/74 mmHg  Pulse 105  Temp(Src) 97.5 F (36.4 C) (Oral)  Ht 5\' 5"  (1.651 m)  Wt 255 lb (115.667 kg)  BMI 42.43 kg/m2  SpO2 95%  LMP 03/27/2014    Objective:  Physical Exam  Constitutional: She appears well-nourished.  HENT:  Right Ear: Tympanic membrane and ear canal normal.  Left Ear: Tympanic membrane and ear canal normal.  Nose: Right sinus exhibits maxillary sinus tenderness. Right sinus exhibits no frontal sinus tenderness. Left sinus exhibits maxillary sinus tenderness. Left sinus exhibits no frontal sinus tenderness.  Mouth/Throat: Oropharynx is clear and moist.  Eyes: Conjunctivae are normal.  Neck: Neck supple.  Cardiovascular: Regular rhythm.   Sinus tachycardia  Pulmonary/Chest: Effort normal. She has wheezes in the right upper field, the right lower field, the left upper field and the left lower  field. She has rhonchi in the right upper field, the right lower field, the left upper field and the left lower field.  Lymphadenopathy:    She has no cervical adenopathy.  Skin: Skin is warm and dry.          Assessment & Plan:  Acute Bronchitis:  Cough with green sputum, nasal congestion with green mucous. Sinus tachycardia in office, exam with wheezing and rhonchi throughout lung fields. Given history and examination, will treat. RX for zpak, prednisone taper, albuterol inhaler provided. Increase fluids, rest. Return precautions provided.

## 2015-11-29 NOTE — Patient Instructions (Signed)
Start Azithromycin antibiotics. Take 2 tablets by mouth today, then 1 tablet daily for 4 additional days.  Use 2 puffs of the Albuterol inhaler every 6 hours as needed for wheezing.   Start Prednisone tablets for inflammation and wheezing. Take 2 tablets for 3 days, then 1 table for 3 days.  Increase consumption of water and rest.  Please give us a call if no improvement.   It was a pleasure meeting you!

## 2015-12-01 ENCOUNTER — Other Ambulatory Visit: Payer: Self-pay | Admitting: Adult Health

## 2015-12-01 MED FILL — DEXTROAMP-AMPHET ER 30 MG C: 30 | 90 days supply | Qty: 90 | Fill #0

## 2015-12-01 MED FILL — AMPHETAMINE SALTS 10 MG TAB: 10 | 90 days supply | Qty: 90 | Fill #0

## 2016-03-15 DIAGNOSIS — F900 Attention-deficit hyperactivity disorder, predominantly inattentive type: Secondary | ICD-10-CM | POA: Diagnosis not present

## 2016-03-15 DIAGNOSIS — F3341 Major depressive disorder, recurrent, in partial remission: Secondary | ICD-10-CM | POA: Diagnosis not present

## 2016-03-15 MED FILL — DEXTROAMP-AMP 10 MG TAB: 10 | 90 days supply | Qty: 90 | Fill #0

## 2016-03-15 MED FILL — BUPROPION HCL XL 300 MG TAB: 300 | 90 days supply | Qty: 90 | Fill #0

## 2016-03-15 MED FILL — clonazePAM 0.5 MG TABS: 0.5 | 30 days supply | Qty: 30 | Fill #0

## 2016-03-15 MED FILL — DEXTROAMP-AMPHET ER 30 MG C: 30 | 90 days supply | Qty: 90 | Fill #0

## 2016-03-15 MED FILL — CITALOPRAM HBR 20 MG TABLET: 20 | 90 days supply | Qty: 90 | Fill #0

## 2016-03-16 ENCOUNTER — Other Ambulatory Visit: Payer: Self-pay | Admitting: Family Medicine

## 2016-03-28 MED FILL — MONTELUKAST SOD 10 MG TAB: 10 | 90 days supply | Qty: 90 | Fill #0

## 2016-06-27 MED FILL — DEXTROAMP-AMP 10 MG TAB: 10 | 90 days supply | Qty: 90 | Fill #0

## 2016-06-27 MED FILL — DEXTROAMP-AMPHET ER 30 MG C: 30 | 90 days supply | Qty: 90 | Fill #0

## 2016-07-13 ENCOUNTER — Ambulatory Visit (INDEPENDENT_AMBULATORY_CARE_PROVIDER_SITE_OTHER): Payer: 59 | Admitting: Family Medicine

## 2016-07-13 ENCOUNTER — Encounter: Payer: Self-pay | Admitting: Family Medicine

## 2016-07-13 VITALS — BP 122/86 | HR 92 | Temp 97.9°F | Wt 260.8 lb

## 2016-07-13 DIAGNOSIS — J069 Acute upper respiratory infection, unspecified: Secondary | ICD-10-CM

## 2016-07-13 MED ORDER — AMOXICILLIN 875 MG PO TABS: 875.0000 mg | ORAL_TABLET | Freq: Two times a day (BID) | ORAL | 0 refills | Status: DC

## 2016-07-13 NOTE — Patient Instructions (Signed)
For nasal congestion you can use Afrin nasal spray for 3 days max, Sudafed, saline nasal spray (generic is fine for all). For cough you can try Delsym. Drink enough fluids to make your urine light yellow. For fever/chill/muscle aches you can take over the counter acetaminophen or ibuprofen.  Please come back in if you are not better in 5-7 days or if you develop wheezing, shortness of breath or persistent vomiting.  If you are not feeling better in 3 days, you can start the antibiotic.

## 2016-07-13 NOTE — Progress Notes (Signed)
Pre-visit discussion using our clinic review tool. No additional management support is needed unless otherwise documented below in the visit note.  

## 2016-07-13 NOTE — Progress Notes (Signed)
   Subjective:    Patient ID: Patricia Bender, female    DOB: Mar 10, 1971, 45 y.o.   MRN: [id number]  HPI This is a 45 yo female who presents today with 6 days of nasal congestion, sore throat, now feels like mucus is "stuck." She quit smoking 1 month ago. Cough productive of light green phlegm. Subjective fever.  Bilateral ear pressure, facial pressure. Some SOB, little wheezing. Fatigued and achy. Taking a combination tylenol/sinus product with some short term relief. Sleeping well. Has albuterol inhaler at home. Rare use.   Past Medical History:  Diagnosis Date  . ADHD (attention deficit hyperactivity disorder)   . Anemia    History  . Anxiety   . Depression   . Seasonal allergies   . SVD (spontaneous vaginal delivery)    x 4   Past Surgical History:  Procedure Laterality Date  . BILATERAL SALPINGECTOMY Bilateral 12/21/2014   Procedure: BILATERAL SALPINGECTOMY;  Surgeon: Leslie AndreaJames E Tomblin II, MD;  Location: [facility];  Service: Gynecology;  Laterality: Bilateral;  . CHOLECYSTECTOMY  03/2011  . LAPAROSCOPIC ASSISTED VAGINAL HYSTERECTOMY N/A 12/21/2014   Procedure: LAPAROSCOPIC ASSISTED VAGINAL HYSTERECTOMY BILATERAL SALPINGECTOMY ;  Surgeon: Leslie AndreaJames E Tomblin II, MD;  Location: [facility];  Service: Gynecology;  Laterality: N/A;  . LAPAROSCOPY N/A 05/19/2014   Procedure: LAPAROSCOPY DIAGNOSTIC;  Surgeon: Leslie AndreaJames E Tomblin II, MD;  Location: [facility];  Service: Gynecology;  Laterality: N/A;  . TUBAL LIGATION    . WISDOM TOOTH EXTRACTION     No family history on file. Social History  Substance Use Topics  . Smoking status: Current Every Day Smoker    Packs/day: 0.50    Years: 21.00    Types: Cigarettes  . Smokeless tobacco: Never Used  . Alcohol use No      Review of Systems Per HPI    Objective:   Physical Exam  Constitutional: She is oriented to person, place, and time. She appears well-developed and well-nourished.  HENT:  Head: Normocephalic and atraumatic.  Right Ear: Tympanic  membrane and ear canal normal.  Left Ear: Tympanic membrane and ear canal normal.  Nose: Mucosal edema and rhinorrhea present. Right sinus exhibits no maxillary sinus tenderness and no frontal sinus tenderness. Left sinus exhibits no maxillary sinus tenderness and no frontal sinus tenderness.  Mouth/Throat: Uvula is midline.  Cardiovascular: Normal rate, regular rhythm and normal heart sounds.   Pulmonary/Chest: Effort normal and breath sounds normal.  Neurological: She is alert and oriented to person, place, and time.  Skin: Skin is warm and dry.  Psychiatric: She has a normal mood and affect. Her behavior is normal. Judgment and thought content normal.  Vitals reviewed.     BP 122/86 (BP Location: Right Arm, Patient Position: Sitting, Cuff Size: Large)   Pulse 92   Temp 97.9 F (36.6 C) (Oral)   Wt 260 lb 12 oz (118.3 kg)   LMP 03/27/2014   SpO2 95%   BMI 43.39 kg/m      Assessment & Plan:  1. Acute upper respiratory infection - suspect viral illness, discussed with patient. Provided written and verbal instructions for symptomatic relief, wait and see antibiotic - RTC precautions reviewed - amoxicillin (AMOXIL) 875 MG tablet; Take 1 tablet (875 mg total) by mouth 2 (two) times daily.  Dispense: 14 tablet; Refill: 0 - celebrated her smoking cessation!  Patricia Reeeborah Uchenna Seufert, FNP-BC  [facility]  07/14/2016 6:53 AM

## 2016-07-14 ENCOUNTER — Encounter: Payer: Self-pay | Admitting: Family Medicine

## 2016-07-16 MED FILL — AMOXICILLIN 875 MG TABLET: 875 | 7 days supply | Qty: 14 | Fill #0

## 2016-08-23 ENCOUNTER — Other Ambulatory Visit: Payer: Self-pay | Admitting: Family Medicine

## 2016-08-23 MED FILL — MONTELUKAST SOD 10 MG TAB: 10 | 90 days supply | Qty: 90 | Fill #0

## 2016-08-29 ENCOUNTER — Encounter: Payer: Self-pay | Admitting: Family Medicine

## 2016-08-29 ENCOUNTER — Ambulatory Visit (INDEPENDENT_AMBULATORY_CARE_PROVIDER_SITE_OTHER): Payer: 59 | Admitting: Family Medicine

## 2016-08-29 VITALS — BP 106/70 | HR 100 | Temp 98.3°F | Ht 65.0 in | Wt 256.8 lb

## 2016-08-29 DIAGNOSIS — J069 Acute upper respiratory infection, unspecified: Secondary | ICD-10-CM

## 2016-08-29 DIAGNOSIS — M5416 Radiculopathy, lumbar region: Secondary | ICD-10-CM | POA: Diagnosis not present

## 2016-08-29 DIAGNOSIS — R0981 Nasal congestion: Secondary | ICD-10-CM | POA: Diagnosis not present

## 2016-08-29 MED ORDER — TIZANIDINE HCL 4 MG PO TABS: 4.0000 mg | ORAL_TABLET | Freq: Every evening | ORAL | 1 refills | Status: AC

## 2016-08-29 MED ORDER — HYDROCODONE-ACETAMINOPHEN 5-325 MG PO TABS: 1.0000 | ORAL_TABLET | Freq: Four times a day (QID) | ORAL | 0 refills | Status: DC | PRN

## 2016-08-29 MED ORDER — PREDNISONE 20 MG PO TABS: ORAL_TABLET | ORAL | 0 refills | Status: DC

## 2016-08-29 NOTE — Progress Notes (Signed)
Pre visit review using our clinic review tool, if applicable. No additional management support is needed unless otherwise documented below in the visit note. 

## 2016-08-29 NOTE — Progress Notes (Signed)
Dr. Karleen Hampshire T. Deontae Robson, MD, CAQ Sports Medicine Primary Care and Sports Medicine [address] [place], [address] Phone: [phone] Fax: [phone]  08/29/2016  Patient: Patricia Bender, MRN: [id number], DOB: 06-19-1971, 45 y.o.  Primary Physician:  Kerby Nora, MD   Chief Complaint  Patient presents with  . Sinusitis  . Back Pain    Lower back radiates downs right leg x 1 1/2 week   Subjective:   Patricia Bender is a 45 y.o. very pleasant female patient who presents with the following: Back Pain  Low back - excruciating and constant pain. Wose with sitting still  With sitting down at work, hurts really bad.  R leg pain.  Showed up fairly instantly.  Jumped up to answer the phone.   Sinus symptoms x 2 days.   Tripping some with walking.  Lateral LE and lateral foot 1st toespace on the R  ongoing for approximately: 10 The patient has had back pain before. The back pain is localized into the lumbar spine area. They also describe R radiculopathy.  ? numbness or tingling. No bowel or bladder incontinence. No focal weakness. Prior interventions: none Physical therapy: No Chiropractic manipulations: No Acupuncture: No Osteopathic manipulation: No Heat or cold: Minimal effect  Past Medical History, Surgical History, Family History, Medications, Allergies have been reviewed and updated if relevant.  Patient Active Problem List   Diagnosis Date Noted  . Acute bronchitis 01/13/2015  . Menorrhagia 12/21/2014  . Pedal edema 10/13/2014  . Rash and nonspecific skin eruption 07/24/2013  . 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 Allergic rhinitis 04/13/2013  . ANXIETY STATE, UNSPECIFIED 01/04/2010  . Depression, major, recurrent, moderate (HCC) 09/08/2009  . TOBACCO ABUSE 07/04/2007    Past Medical History:  Diagnosis Date  . ADHD (attention deficit hyperactivity disorder)   . Anemia    History  . Anxiety   . Depression   . Seasonal  allergies   . SVD (spontaneous vaginal delivery)    x 4    Past Surgical History:  Procedure Laterality Date  . BILATERAL SALPINGECTOMY Bilateral 12/21/2014   Procedure: BILATERAL SALPINGECTOMY;  Surgeon: Leslie Andrea, MD;  Location: [facility];  Service: Gynecology;  Laterality: Bilateral;  . CHOLECYSTECTOMY  03/2011  . LAPAROSCOPIC ASSISTED VAGINAL HYSTERECTOMY N/A 12/21/2014   Procedure: LAPAROSCOPIC ASSISTED VAGINAL HYSTERECTOMY BILATERAL SALPINGECTOMY ;  Surgeon: Leslie Andrea, MD;  Location: [facility];  Service: Gynecology;  Laterality: N/A;  . LAPAROSCOPY N/A 05/19/2014   Procedure: LAPAROSCOPY DIAGNOSTIC;  Surgeon: Leslie Andrea, MD;  Location: [facility];  Service: Gynecology;  Laterality: N/A;  . TUBAL LIGATION    . WISDOM TOOTH EXTRACTION      Social History   Social History  . Marital status: Married    Spouse name: N/A  . Number of children: 4  . Years of education: N/A   Occupational History  . [employer]    Social History Main Topics  . Smoking status: Former Smoker    Packs/day: 0.50    Years: 21.00    Types: Cigarettes    Quit date: 05/26/2016  . Smokeless tobacco: Never Used  . Alcohol use No  . Drug use: No  . Sexual activity: Yes    Birth control/ protection: Surgical   Other Topics Concern  . Not on file   Social History Narrative   Works in Pathology at [employer]    No family history on file.  No Known Allergies  [facility].  GEN: No fevers, chills. Nontoxic. Primarily MSK c/o today. MSK: Detailed in the HPI GI: tolerating PO intake without difficulty Neuro: As above  Otherwise the pertinent positives of the ROS are noted above.    Objective:   Blood pressure 106/70, pulse 100, temperature 98.3 F (36.8 C), temperature source Oral, height 5\' 5"  (1.651 m), weight 256 lb 12 oz (116.5 kg), last menstrual period 03/27/2014.   Gen: WDWN, NAD; alert,appropriate and cooperative throughout  exam    HEENT: Normocephalic and atraumatic. Throat clear, w/o exudate, no LAD, R TM clear, L TM - good landmarks, No fluid present. rhinnorhea.  Left frontal and maxillary sinuses: Tender, max Right frontal and maxillary sinuses: Tender, max   Neck: No ant or post LAD  CV: RRR, No M/G/R  Pulm: Breathing comfortably in no resp distress. no w/c/r  Abd: S,NT,ND,+BS Extr: no c/c/e Psych: full affect, pleasant   Range of motion at  the waist: Flexion, rotation and lateral bending: full  No echymosis or edema Rises to examination table with no difficulty Gait: minimally antalgic  Inspection/Deformity: No abnormality Paraspinus T:  l4-5 ttp  B Ankle Dorsiflexion (L5,4): 5/5 B Great Toe Dorsiflexion (L5,4): 5/5 Heel Walk (L5): WNL Toe Walk (S1): WNL Rise/Squat (L4): WNL, mild pain  SENSORY B Medial Foot (L4): WNL B Dorsum (L5): WNL B Lateral (S1): DECREASED Light Touch: DECREASED LATERAL LEG AND FOOT Pinprick: DECREASED LATERAL LEG AND FOOT  REFLEXES Knee (L4): 2+ Ankle (S1): 2+  B SLR, seated: neg B SLR, supine: + PAIN B FABER: neg B Reverse FABER: neg B Greater Troch: NT B Log Roll: neg B Stork: NT B Sciatic Notch: NT  Radiology: No results found.  Assessment and Plan:   Lumbar radiculopathy, right  Sinus congestion  Acute URI  Acute radiculopathy with decreased right lateral lower extremity. Strength is intact. Disc herniation most likely. Prednisone and conservative care for now with follow-up.  URI, doubtful bacterial in origin. Continue with conservative care, prednisone may additionally help this.  Follow-up: Return in about 3 weeks (around 09/19/2016).  New Prescriptions   HYDROCODONE-ACETAMINOPHEN (NORCO/VICODIN) 5-325 MG TABLET    Take 1 tablet by mouth every 6 (six) hours as needed for moderate pain.   PREDNISONE (DELTASONE) 20 MG TABLET    2 tabs po for 5 days, then 1 tab po for 5 days   TIZANIDINE (ZANAFLEX) 4 MG TABLET    Take 1 tablet (4 mg  total) by mouth Nightly.   Signed,  Elpidio GaleaSpencer T. Lashandra Arauz, MD   Patient's Medications  New Prescriptions   HYDROCODONE-ACETAMINOPHEN (NORCO/VICODIN) 5-325 MG TABLET    Take 1 tablet by mouth every 6 (six) hours as needed for moderate pain.   PREDNISONE (DELTASONE) 20 MG TABLET    2 tabs po for 5 days, then 1 tab po for 5 days   TIZANIDINE (ZANAFLEX) 4 MG TABLET    Take 1 tablet (4 mg total) by mouth Nightly.  Previous Medications   ALBUTEROL (PROVENTIL HFA;VENTOLIN HFA) 108 (90 BASE) MCG/ACT INHALER    Inhale 2 puffs into the lungs every 6 (six) hours as needed for wheezing or shortness of breath.   AMPHETAMINE-DEXTROAMPHETAMINE (ADDERALL XR) 30 MG 24 HR CAPSULE    Take 30 mg by mouth daily.   CITALOPRAM (CELEXA) 20 MG TABLET    Take 20 mg by mouth daily.   MONTELUKAST (SINGULAIR) 10 MG TABLET    TAKE 1 TABLET BY  MOUTH ONCE DAILY AT BEDTIME  Modified Medications   No medications on file  Discontinued Medications   AMOXICILLIN (AMOXIL) 875 MG TABLET    Take 1 tablet (875 mg total) by mouth 2 (two) times daily.

## 2016-09-19 ENCOUNTER — Ambulatory Visit (INDEPENDENT_AMBULATORY_CARE_PROVIDER_SITE_OTHER)
Admission: RE | Admit: 2016-09-19 | Discharge: 2016-09-19 | Disposition: A | Discharge status: Home / Self Care | Payer: 59 | Source: Ambulatory Visit | Attending: Family Medicine | Admitting: Family Medicine

## 2016-09-19 ENCOUNTER — Encounter: Payer: Self-pay | Admitting: Family Medicine

## 2016-09-19 ENCOUNTER — Ambulatory Visit (INDEPENDENT_AMBULATORY_CARE_PROVIDER_SITE_OTHER): Payer: 59 | Admitting: Family Medicine

## 2016-09-19 VITALS — BP 106/72 | HR 96 | Temp 98.3°F | Ht 65.0 in | Wt 258.5 lb

## 2016-09-19 DIAGNOSIS — J0100 Acute maxillary sinusitis, unspecified: Secondary | ICD-10-CM | POA: Diagnosis not present

## 2016-09-19 DIAGNOSIS — M5416 Radiculopathy, lumbar region: Secondary | ICD-10-CM | POA: Diagnosis not present

## 2016-09-19 DIAGNOSIS — M545 Low back pain: Secondary | ICD-10-CM | POA: Diagnosis not present

## 2016-09-19 MED ORDER — DOXYCYCLINE HYCLATE 100 MG PO TABS: 100.0000 mg | ORAL_TABLET | Freq: Two times a day (BID) | ORAL | 0 refills | Status: AC

## 2016-09-19 MED FILL — DOXYCYCLINE HYCLATE 100 MG: 100 | 10 days supply | Qty: 20 | Fill #0

## 2016-09-19 NOTE — Patient Instructions (Signed)

## 2016-09-19 NOTE — Progress Notes (Signed)
Pre visit review using our clinic review tool, if applicable. No additional management support is needed unless otherwise documented below in the visit note. 

## 2016-09-19 NOTE — Progress Notes (Signed)
Dr. Karleen Hampshire T. Tykwon Fera, MD, CAQ Sports Medicine Primary Care and Sports Medicine [address] [place], [address] Phone: [phone] Fax: [phone]  09/19/2016  Patient: Patricia Bender, MRN: [id number], DOB: [birth year], 45 y.o.  Primary Physician:  Kerby Nora, MD   Chief Complaint  Patient presents with  . Follow-up    Lumbar Radiculopathy   Subjective:   Patricia Bender is a 45 y.o. very pleasant female patient who presents with the following: Back Pain  Not really improved at all. The patient is doing poorly from a clinical standpoint greater than 4 weeks and history of lumbar radiculopathy on the right side. She is not improved at all after her course of prednisone along with some pain medication. She relates that she is in significant pain virtually all of the time. She has not had any additional falls. No prior neurosurgery.  Body mass index is 43.02 kg/m.   Still having some congestion and coughing.  Subjective sensation of weakness on the right and will feel like she will give out some.  She also still feels unwell from pulmonary standpoint. She is coughing up sputum quite a bit and she overall does not feel well or improved from her acute illness and sinus symptoms that she has had that I saw her for 3 weeks ago in addition to her low back pain.  08/29/2016 Last OV with Hannah Beat, MD  Low back - excruciating and constant pain. Wose with sitting still  With sitting down at work, hurts really bad.  R leg pain.  Showed up fairly instantly.  Jumped up to answer the phone.   Sinus symptoms x 2 days.   Tripping some with walking.  Lateral LE and lateral foot 1st toespace on the R  ongoing for approximately: 10 The patient has had back pain before. The back pain is localized into the lumbar spine area. They also describe R radiculopathy.  ? numbness or tingling. No bowel or bladder incontinence. No focal weakness. Prior interventions:  none Physical therapy: No Chiropractic manipulations: No Acupuncture: No Osteopathic manipulation: No Heat or cold: Minimal effect  Past Medical History, Surgical History, Family History, Medications, Allergies have been reviewed and updated if relevant.  Patient Active Problem List   Diagnosis Date Noted  . Acute bronchitis 01/13/2015  . Menorrhagia 12/21/2014  . Pedal edema 10/13/2014  . Rash and nonspecific skin eruption 07/24/2013  . 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 Allergic rhinitis 04/13/2013  . ANXIETY STATE, UNSPECIFIED 01/04/2010  . Depression, major, recurrent, moderate (HCC) 09/08/2009  . TOBACCO ABUSE 07/04/2007    Past Medical History:  Diagnosis Date  . ADHD (attention deficit hyperactivity disorder)   . Anemia    History  . Anxiety   . Depression   . Seasonal allergies   . SVD (spontaneous vaginal delivery)    x 4    Past Surgical History:  Procedure Laterality Date  . BILATERAL SALPINGECTOMY Bilateral 12/21/2014   Procedure: BILATERAL SALPINGECTOMY;  Surgeon: Leslie Andrea, MD;  Location: [facility];  Service: Gynecology;  Laterality: Bilateral;  . CHOLECYSTECTOMY  03/2011  . LAPAROSCOPIC ASSISTED VAGINAL HYSTERECTOMY N/A 12/21/2014   Procedure: LAPAROSCOPIC ASSISTED VAGINAL HYSTERECTOMY BILATERAL SALPINGECTOMY ;  Surgeon: Leslie Andrea, MD;  Location: [facility];  Service: Gynecology;  Laterality: N/A;  . LAPAROSCOPY N/A 05/19/2014   Procedure: LAPAROSCOPY DIAGNOSTIC;  Surgeon: Leslie Andrea, MD;  Location: [facility];  Service: Gynecology;  Laterality: N/A;  .  TUBAL LIGATION    . WISDOM TOOTH EXTRACTION      Social History   Social History  . Marital status: Married    Spouse name: N/A  . Number of children: 4  . 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 Smoking status: Former Smoker    Packs/day: 0.50    Years: 21.00    Types: Cigarettes    Quit date: 05/26/2016  .  Smokeless tobacco: Never Used  . Alcohol use No  . Drug use: No  . Sexual activity: Yes    Birth control/ protection: Surgical   Other Topics Concern  . 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  No Known Allergies  Medication list reviewed and updated in full in [place] [facility].  ROS: GEN: Acute illness details above GI: Tolerating PO intake GU: maintaining adequate hydration and urination Pulm: No SOB Interactive and getting along well at home.  Otherwise, ROS is as per the HPI.   Objective:   Blood pressure 106/72, pulse 96, temperature 98.3 F (36.8 C), temperature source Oral, height 5\' 5"  (1.651 m), weight 258 lb 8 oz (117.3 kg), last menstrual period 03/27/2014.   Gen: WDWN, NAD; alert,appropriate and cooperative throughout exam    HEENT: Normocephalic and atraumatic. Throat clear, w/o exudate, no LAD, R TM clear, L TM - good landmarks, No fluid present. rhinnorhea.  Left frontal and maxillary sinuses: Tender, max Right frontal and maxillary sinuses: Tender, max   Neck: No ant or post LAD  CV: RRR, No M/G/R  Pulm: Breathing comfortably in no resp distress. no w/c/r  Abd: S,NT,ND,+BS Extr: no c/c/e Psych: full affect, pleasant   Range of motion at  the waist: Flexion, rotation and lateral bending: full  No echymosis or edema Rises to examination table with no difficulty Gait: minimally antalgic  Inspection/Deformity: No abnormality Paraspinus T:  l4-5 ttp  B Ankle Dorsiflexion (L5,4): 5/5 B Great Toe Dorsiflexion (L5,4): 5/5 Heel Walk (L5): WNL Toe Walk (S1): WNL Rise/Squat (L4): WNL, mild pain  SENSORY B Medial Foot (L4): WNL B Dorsum (L5): WNL B Lateral (S1): DECREASED Light Touch: DECREASED LATERAL LEG AND FOOT Pinprick: DECREASED LATERAL LEG AND FOOT  REFLEXES Knee (L4): 2+ Ankle (S1): 2+  B SLR, seated: neg B SLR, supine: + PAIN B FABER: neg B Reverse FABER: neg B Greater  Troch: NT B Log Roll: neg B Stork: NT B Sciatic Notch: NT  Radiology: Dg Lumbar Spine Complete  Result Date: 09/19/2016 CLINICAL DATA:  Low back pain with RIGHT radiculopathy for 1 month, failure of conservative care EXAM: LUMBAR SPINE - COMPLETE 4+ VIEW COMPARISON:  None FINDINGS: Six non-rib-bearing lumbar type vertebra with incompletely lumbarized S1 segment. Disc space narrowing L5-S1. Vertebral body and disc space heights otherwise maintained. No acute fracture, subluxation, or bone destruction. No spondylolysis. SI joints preserved. IMPRESSION: Incompletely lumbarized S1 segment. Mild degenerative disc disease changes L5-S1. No acute bony abnormalities. Electronically Signed   By: Ulyses Southward M.D.   On: 09/19/2016 09:32    Assessment and Plan:   Lumbar radiculopathy, right - Plan: DG Lumbar Spine Complete, MR LUMBAR SPINE WO CONTRAST, Ambulatory referral to Physical Therapy  Acute non-recurrent maxillary sinusitis   Doxycycline for ongoing illness times 3 weeks.  Lumbar radiculopathy with positive straight leg raise and decreased sensation on the right  lower extremity times 4 weeks with failure of initial management with NSAIDs, prednisone, pain medications, home rehabilitation. The patient is being sent a formal physical therapy, and we will also obtain a MRI of the lumbar spine to evaluate for potential spinal stenosis, cord edema, foraminal encroachment upon nerve, or other acute abnormality that could be causing the patient's ongoing pain and neurological changes as well as decreased sensation.  Follow-up: depending on results of MRI  New Prescriptions   DOXYCYCLINE (VIBRA-TABS) 100 MG TABLET    Take 1 tablet (100 mg total) by mouth 2 (two) times daily.   Orders Placed This Encounter  Procedures  . DG Lumbar Spine Complete  . MR LUMBAR SPINE WO CONTRAST  . Ambulatory referral to Physical Therapy    Signed,  Karleen HampshireSpencer T. Makana Feigel, MD   Patient's Medications  New  Prescriptions   DOXYCYCLINE (VIBRA-TABS) 100 MG TABLET    Take 1 tablet (100 mg total) by mouth 2 (two) times daily.  Previous Medications   ALBUTEROL (PROVENTIL HFA;VENTOLIN HFA) 108 (90 BASE) MCG/ACT INHALER    Inhale 2 puffs into the lungs every 6 (six) hours as needed for wheezing or shortness of breath.   AMPHETAMINE-DEXTROAMPHETAMINE (ADDERALL XR) 30 MG 24 HR CAPSULE    Take 30 mg by mouth daily.   CITALOPRAM (CELEXA) 20 MG TABLET    Take 20 mg by mouth daily.   HYDROCODONE-ACETAMINOPHEN (NORCO/VICODIN) 5-325 MG TABLET    Take 1 tablet by mouth every 6 (six) hours as needed for moderate pain.   MONTELUKAST (SINGULAIR) 10 MG TABLET    TAKE 1 TABLET BY MOUTH ONCE DAILY AT BEDTIME   PREDNISONE (DELTASONE) 20 MG TABLET    2 tabs po for 5 days, then 1 tab po for 5 days  Modified Medications   No medications on file  Discontinued Medications   No medications on file

## 2016-09-22 ENCOUNTER — Ambulatory Visit (HOSPITAL_COMMUNITY)
Admission: RE | Admit: 2016-09-22 | Discharge: 2016-09-22 | Disposition: A | Discharge status: Home / Self Care | Payer: 59 | Source: Ambulatory Visit | Attending: Family Medicine | Admitting: Family Medicine

## 2016-09-22 DIAGNOSIS — M5416 Radiculopathy, lumbar region: Secondary | ICD-10-CM | POA: Diagnosis not present

## 2016-09-22 DIAGNOSIS — M48061 Spinal stenosis, lumbar region without neurogenic claudication: Secondary | ICD-10-CM | POA: Insufficient documentation

## 2016-09-22 DIAGNOSIS — M5126 Other intervertebral disc displacement, lumbar region: Secondary | ICD-10-CM | POA: Diagnosis not present

## 2016-09-22 DIAGNOSIS — M461 Sacroiliitis, not elsewhere classified: Secondary | ICD-10-CM | POA: Insufficient documentation

## 2016-09-25 ENCOUNTER — Ambulatory Visit: Payer: 59 | Attending: Family Medicine | Admitting: Physical Therapy

## 2016-09-25 DIAGNOSIS — R262 Difficulty in walking, not elsewhere classified: Secondary | ICD-10-CM | POA: Insufficient documentation

## 2016-09-25 DIAGNOSIS — M48061 Spinal stenosis, lumbar region without neurogenic claudication: Secondary | ICD-10-CM | POA: Diagnosis not present

## 2016-09-25 DIAGNOSIS — M545 Low back pain: Secondary | ICD-10-CM | POA: Insufficient documentation

## 2016-09-25 NOTE — Therapy (Signed)
[facility] [address] [place], [place], [address] Phone: [phone]   Fax:  [phone]  Physical Therapy Evaluation  Patient Details  Name: Patricia Bender MRN: [id number] Date of Birth: 09-05-1971 Referring Provider: Juleen ChinaSpencer T Copland, MD  Encounter Date: 09/25/2016      PT End of Session - 09/25/16 0928    Visit Number 1   Number of Visits 9   Date for PT Re-Evaluation 10/26/16   Authorization Type [facility]   PT Start Time [phone]  pt arrived late   PT Stop Time 0929   PT Time Calculation (min) 35 min   Activity Tolerance Patient tolerated treatment well   Behavior During Therapy [facility] for tasks assessed/performed      Past Medical History:  Diagnosis Date  . ADHD (attention deficit hyperactivity disorder)   . Anemia    History  . Anxiety   . Depression   . Seasonal allergies   . SVD (spontaneous vaginal delivery)    x 4    Past Surgical History:  Procedure Laterality Date  . BILATERAL SALPINGECTOMY Bilateral 12/21/2014   Procedure: BILATERAL SALPINGECTOMY;  Surgeon: Leslie AndreaJames E Tomblin II, MD;  Location: [facility];  Service: Gynecology;  Laterality: Bilateral;  . CHOLECYSTECTOMY  03/2011  . LAPAROSCOPIC ASSISTED VAGINAL HYSTERECTOMY N/A 12/21/2014   Procedure: LAPAROSCOPIC ASSISTED VAGINAL HYSTERECTOMY BILATERAL SALPINGECTOMY ;  Surgeon: Leslie AndreaJames E Tomblin II, MD;  Location: [facility];  Service: Gynecology;  Laterality: N/A;  . LAPAROSCOPY N/A 05/19/2014   Procedure: LAPAROSCOPY DIAGNOSTIC;  Surgeon: Leslie AndreaJames E Tomblin II, MD;  Location: [facility];  Service: Gynecology;  Laterality: N/A;  . TUBAL LIGATION    . WISDOM TOOTH EXTRACTION      There were no vitals filed for this visit.       Subjective Assessment - 09/25/16 0856    Subjective Lower back pain that occasionally radiates down R leg. sitting or walking increases pain. Began a little over a month ago.    Diagnostic tests MRI: 1. At L4-5 there is a broad central disc  protrusion. Moderate bilateral facet arthropathy. Bilateral lateral recess stenosis and moderate spinal stenosis. 2. Partially visualize unilateral right sacroiliitis.   Patient Stated Goals bending to lift, just wants pain to go away.    Currently in Pain? Yes   Pain Score 8    Pain Location Back   Pain Orientation Right   Pain Descriptors / Indicators Stabbing   Pain Type Acute pain   Pain Radiating Towards R leg   Aggravating Factors  standing, walking   Pain Relieving Factors lay down            [facility] - 09/25/16 0001      Assessment   Medical Diagnosis LBP   Referring Provider Juleen ChinaSpencer T Copland, MD   Hand Dominance Right   Next MD Visit not scheduled   Prior Therapy no     Precautions   Precautions None     Restrictions   Weight Bearing Restrictions No     Balance Screen   Has the patient fallen in the past 6 months No     Home Environment   Living Environment Private residence   Living Arrangements Children;Spouse/significant other   Additional Comments stairs at home     Prior Function   Level of Independence Independent   Vocation Requirements changing machinery, lifting     Cognition   Overall Cognitive Status Within Functional Limits for tasks assessed  ROM / Strength   AROM / PROM / Strength AROM;Strength     AROM   AROM Assessment Site Lumbar   Lumbar Flexion 0  no motion registered at lower lumbar spine     Strength   Strength Assessment Site Hip   Right/Left Hip Right;Left   Right Hip Flexion 5/5   Right Hip ABduction 4+/5   Left Hip Flexion 4/5   Left Hip ABduction 5/5     Palpation   Palpation comment L SLR pain in back. TTP R SIJ                   [facility] Adult PT Treatment/Exercise - 09/25/16 0001      Exercises   Exercises Lumbar     Lumbar Exercises: Standing   Other Standing Lumbar Exercises standing hinging with yard stick     Lumbar Exercises: Supine   AB Set Limitations practiced seated,  supine and standing                PT Education - 09/25/16 1203    Education provided Yes   Education Details anatomy of condition, POC, HEP, exercise form/rationale, fleixon bias   Person(s) Educated Patient   Methods Explanation;Demonstration;Tactile cues;Verbal cues;Handout   Comprehension Verbalized understanding;Returned demonstration;Verbal cues required;Tactile cues required;Need further instruction          PT Short Term Goals - 09/25/16 1211      PT SHORT TERM GOAL #1   Title Pt will be able to perform lifting activities at work with appropriate form and minimal discomfort in low back by 12/1   Baseline severe pain at eval   Time 4   Period Weeks   Status New     PT SHORT TERM GOAL #2   Title Pt will be able to sit for required time to finish work activities LBP <=3/10   Baseline severe pain at eval   Time 4   Period Weeks   Status New     PT SHORT TERM GOAL #3   Title Pt will verbalize resolution of radicular pain to lower extremity   Baseline occasional radiating pain with extended standing or sitting   Time 4   Period Weeks   Status New     PT SHORT TERM GOAL #4   Title Max pain at work/during daily activities <=4/10   Baseline severe pain at eval   Time 4   Period Weeks   Status New                  Plan - 09/25/16 1204    Clinical Impression Statement Pt presents to PT with complaints of LBP that occasionally radiates down her R leg. Posture consists of notably increased lumbar lordosis and anterior shift in pelvis. Pt responded well to treatment today and was able to demonstrate appropriate abdominal recruitment. Able to demo improved hinge with training as well. PT will benefit from further skilled PT in order to improve abdominal support for low back and train proper lifting and bending mechanics for work appropraite activities.    Rehab Potential Good   PT Frequency 2x / week   PT Duration 4 weeks   PT Treatment/Interventions  ADLs/Self Care Home Management;Cryotherapy;Electrical Stimulation;Iontophoresis 4mg /ml Dexamethasone;Functional mobility training;Gait training;Ultrasound;Traction;Moist Heat;Therapeutic activities;Therapeutic exercise;Balance training;Neuromuscular re-education;Patient/family education;Passive range of motion;Manual techniques;Dry needling;Taping   PT Next Visit Plan hip hinge, abdominal strength/endurance, lifting/reaching posture   PT Home Exercise Plan abdominal bracing in all positions, hip hinge with dowel, flexion  stretch at desk.    Consulted and Agree with Plan of Care Patient      Patient will benefit from skilled therapeutic intervention in order to improve the following deficits and impairments:  Decreased range of motion, Difficulty walking, Increased muscle spasms, Decreased activity tolerance, Pain, Improper body mechanics, Decreased strength, Postural dysfunction  Visit Diagnosis: Acute right-sided low back pain, with sciatica presence unspecified - Plan: PT plan of care cert/re-cert  Difficulty in walking, not elsewhere classified - Plan: PT plan of care cert/re-cert     Problem List Patient Active Problem List   Diagnosis Date Noted  . Acute bronchitis 01/13/2015  . Menorrhagia 12/21/2014  . Pedal edema 10/13/2014  . Rash and nonspecific skin eruption 07/24/2013  . 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 Allergic rhinitis 04/13/2013  . ANXIETY STATE, UNSPECIFIED 01/04/2010  . Depression, major, recurrent, moderate (HCC) 09/08/2009  . TOBACCO ABUSE 07/04/2007    Einer Meals C. Zayli Villafuerte PT, DPT 09/25/16 12:17 PM   [facility] [address] [place], [place], [address] Phone: [phone]   Fax:  [phone]  Name: Patricia Bender MRN: [id number] Date of Birth: [birth year]

## 2016-09-27 MED FILL — CITALOPRAM HBR 20 MG TABLET: 20 | 90 days supply | Qty: 90 | Fill #0

## 2016-09-27 MED FILL — clonazePAM 0.5 MG TABS: 0.5 | 30 days supply | Qty: 30 | Fill #0

## 2016-09-27 MED FILL — DEXTROAMP-AMPHET ER 30 MG C: 30 | 90 days supply | Qty: 90 | Fill #0

## 2016-09-27 MED FILL — BUPROPION HCL XL 300 MG TAB: 300 | 90 days supply | Qty: 90 | Fill #0

## 2016-09-27 MED FILL — DEXTROAMP-AMPHETAMIN 10 MG: 10 | 90 days supply | Qty: 90 | Fill #0

## 2016-10-02 ENCOUNTER — Ambulatory Visit: Payer: 59 | Attending: Family Medicine | Admitting: Physical Therapy

## 2016-10-02 ENCOUNTER — Encounter: Payer: Self-pay | Admitting: Physical Therapy

## 2016-10-02 DIAGNOSIS — R262 Difficulty in walking, not elsewhere classified: Secondary | ICD-10-CM | POA: Diagnosis not present

## 2016-10-02 DIAGNOSIS — M545 Low back pain: Secondary | ICD-10-CM | POA: Diagnosis not present

## 2016-10-02 NOTE — Therapy (Addendum)
[place], [place], [address] Phone: [phone]   Fax:  [phone]  Physical Therapy Treatment/Discharge  Patient Details  Name: KINZLIE HARNEY MRN: [id number] Date of Birth: 1971/11/04 Referring Provider: Kathryne Eriksson, MD  Encounter Date: 10/02/2016      PT End of Session - 10/02/16 0849    Visit Number 2   Number of Visits 9   Date for PT Re-Evaluation 10/26/16   PT Start Time 0849   PT Stop Time 0928   PT Time Calculation (min) 39 min   Activity Tolerance Patient tolerated treatment well   Behavior During Therapy [facility] for tasks assessed/performed      Past Medical History:  Diagnosis Date  . ADHD (attention deficit hyperactivity disorder)   . Anemia    History  . Anxiety   . Depression   . Seasonal allergies   . SVD (spontaneous vaginal delivery)    x 4    Past Surgical History:  Procedure Laterality Date  . BILATERAL SALPINGECTOMY Bilateral 12/21/2014   Procedure: BILATERAL SALPINGECTOMY;  Surgeon: Allena Katz, MD;  Location: [place] [facility];  Service: Gynecology;  Laterality: Bilateral;  . CHOLECYSTECTOMY  03/2011  . LAPAROSCOPIC ASSISTED VAGINAL HYSTERECTOMY N/A 12/21/2014   Procedure: LAPAROSCOPIC ASSISTED VAGINAL HYSTERECTOMY BILATERAL SALPINGECTOMY ;  Surgeon: Allena Katz, MD;  Location: [place] [facility];  Service: Gynecology;  Laterality: N/A;  . LAPAROSCOPY N/A 05/19/2014   Procedure: LAPAROSCOPY DIAGNOSTIC;  Surgeon: Allena Katz, MD;  Location: [place] [facility];  Service: Gynecology;  Laterality: N/A;  . TUBAL LIGATION    . WISDOM TOOTH EXTRACTION      There were no vitals filed for this visit.      Subjective Assessment - 10/02/16 0849    Subjective Pt reports back did a little better utilizing HEP. Was a little slow at work, feels like she has been sitting too long. Denies pain into lower extremity   Patient Stated Goals bending to lift, just wants pain to go away.     Currently in Pain? Yes   Pain Score 5    Pain Location Back   Pain Orientation Medial   Pain Descriptors / Indicators Aching                         [facility] Adult PT Treatment/Exercise - 10/02/16 0001      Lumbar Exercises: Stretches   Active Hamstring Stretch Other (comment)  bilateral 2x10   Passive Hamstring Stretch Limitations seated EOB   Quadruped Mid Back Stretch Limitations child pose   Piriformis Stretch 30 seconds;2 reps   Piriformis Stretch Limitations gastroc stretch slant board     Lumbar Exercises: Aerobic   Stationary Bike nu step 5 min L5     Lumbar Exercises: Standing   Other Standing Lumbar Exercises hip hinge with twists     Lumbar Exercises: Seated   Sit to Stand Limitations ball bw knees     Lumbar Exercises: Supine   Bent Knee Raise 20 reps   Bent Knee Raise Limitations cues for pelvic tilt   Bridge Limitations mini bridge with ball squeeze, in DF   Isometric Hip Flexion Limitations bilat legs ext to ceiling, pelvic tilt, rest knees to chest     Lumbar Exercises: Quadruped   [place] Limitations qped pelvic tilt     Manual Therapy   Manual Therapy Soft tissue mobilization   Soft tissue mobilization R QL  and paraspinals                PT Education - 10/02/16 [phone]    Education provided Yes   Education Details exercise form/rationale   Person(s) Educated Patient   Methods Explanation;Demonstration;Tactile cues;Verbal cues   Comprehension Verbalized understanding;Returned demonstration;Verbal cues required;Tactile cues required;Need further instruction          PT Short Term Goals - 09/25/16 1211      PT SHORT TERM GOAL #1   Title Pt will be able to perform lifting activities at work with appropriate form and minimal discomfort in low back by 12/1   Baseline severe pain at eval   Time 4   Period Weeks   Status New     PT SHORT TERM GOAL #2   Title Pt will be able to sit for required time to finish work  activities LBP <=3/10   Baseline severe pain at eval   Time 4   Period Weeks   Status New     PT SHORT TERM GOAL #3   Title Pt will verbalize resolution of radicular pain to lower extremity   Baseline occasional radiating pain with extended standing or sitting   Time 4   Period Weeks   Status New     PT SHORT TERM GOAL #4   Title Max pain at work/during daily activities <=4/10   Baseline severe pain at eval   Time 4   Period Weeks   Status New                  Plan - 10/02/16 0109    Clinical Impression Statement Heavy cuing required for activation of abdominal wall with exercises. Tends to sidebend for reaching and lifting rather than twisting, was able to verbalize sensation of appropriate core control to twist when reaching to decrease back pain.    PT Next Visit Plan lifting weight from floor, rotational control   PT Home Exercise Plan abdominal bracing in all positions, hip hinge with dowel, flexion stretch at desk. hip hinge with trunk twist   Consulted and Agree with Plan of Care Patient      Patient will benefit from skilled therapeutic intervention in order to improve the following deficits and impairments:     Visit Diagnosis: Acute right-sided low back pain, with sciatica presence unspecified  Difficulty in walking, not elsewhere classified     Problem List Patient Active Problem List   Diagnosis Date Noted  . Acute bronchitis 01/13/2015  . Menorrhagia 12/21/2014  . Pedal edema 10/13/2014  . Rash and nonspecific skin eruption 07/24/2013  . 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 Allergic rhinitis 04/13/2013  . ANXIETY STATE, UNSPECIFIED 01/04/2010  . Depression, major, recurrent, moderate ([place]) 09/08/2009  . TOBACCO ABUSE 07/04/2007    Brittnae Aschenbrenner C. Neeraj Housand PT, DPT 10/02/16 9:30 AM   [place], [place], [address] Phone: [phone]   Fax:   [phone]  Name: GUNHILD BAUTCH MRN: [id number] Date of Birth: Feb 22, 1971  PHYSICAL THERAPY DISCHARGE SUMMARY  Visits from Start of Care: 2  Current functional level related to goals / functional outcomes: See above   Remaining deficits: See above   Education / Equipment: Anatomy of condition, POC, HEP, exercise form/rationale  Plan: Patient agrees to discharge.  Patient goals were not met. Patient is being discharged due to not returning since the last visit.  ?????    Lenzi Marmo C. Arun Herrod PT, DPT 11/21/16  3:39 PM

## 2016-10-04 ENCOUNTER — Ambulatory Visit: Payer: 59 | Admitting: Physical Therapy

## 2016-10-09 ENCOUNTER — Ambulatory Visit: Payer: 59 | Admitting: Physical Therapy

## 2016-10-11 ENCOUNTER — Ambulatory Visit: Payer: 59 | Admitting: Physical Therapy

## 2016-10-16 ENCOUNTER — Ambulatory Visit: Payer: 59 | Admitting: Physical Therapy

## 2016-10-23 ENCOUNTER — Ambulatory Visit: Payer: 59 | Admitting: Physical Therapy

## 2016-10-25 ENCOUNTER — Ambulatory Visit: Payer: 59 | Admitting: Physical Therapy

## 2016-10-30 ENCOUNTER — Ambulatory Visit: Payer: 59 | Admitting: Physical Therapy

## 2016-12-26 DIAGNOSIS — F900 Attention-deficit hyperactivity disorder, predominantly inattentive type: Secondary | ICD-10-CM | POA: Diagnosis not present

## 2016-12-26 DIAGNOSIS — F3341 Major depressive disorder, recurrent, in partial remission: Secondary | ICD-10-CM | POA: Diagnosis not present

## 2016-12-26 MED FILL — BUPROPION HCL XL 300 MG TAB: 300 | 90 days supply | Qty: 90 | Fill #0

## 2016-12-26 MED FILL — clonazePAM 0.5 MG TABS: 0.5 | 30 days supply | Qty: 30 | Fill #0

## 2016-12-26 MED FILL — CITALOPRAM HBR 20 MG TABLET: 20 | 90 days supply | Qty: 90 | Fill #0

## 2016-12-27 MED FILL — ADDERALL XR 30 MG CAP SA: 30 | 90 days supply | Qty: 90 | Fill #0

## 2016-12-27 MED FILL — tiZANidine HCL 4 MG TABS: 4 | 30 days supply | Qty: 30 | Fill #0

## 2016-12-27 MED FILL — DEXTROAMP-AMPHETAMIN 10 MG: 10 | 90 days supply | Qty: 90 | Fill #0

## 2017-01-02 DIAGNOSIS — H5203 Hypermetropia, bilateral: Secondary | ICD-10-CM | POA: Diagnosis not present

## 2017-01-25 MED FILL — clonazePAM 0.5 MG TABS: 0.5 | 30 days supply | Qty: 30 | Fill #1

## 2017-01-31 ENCOUNTER — Other Ambulatory Visit: Payer: Self-pay | Admitting: Family Medicine

## 2017-01-31 MED FILL — MONTELUKAST SOD 10 MG TAB: 10 | 90 days supply | Qty: 90 | Fill #0

## 2017-01-31 NOTE — Telephone Encounter (Signed)
Letter sent through MyChart and the mail asking patient to call the office and schedule CPE with Dr. Ermalene SearingBedsole.

## 2017-01-31 NOTE — Telephone Encounter (Signed)
Last office visit 09/19/16 with Dr. Patsy Lageropland for back pain.  Last seen by PCP 12/07/14.  Seen by NPs  07/13/16 for URI & 11/29/15 for wheezing.  Ok to refill?

## 2017-01-31 NOTE — Telephone Encounter (Signed)
Okay to refill until she sets up appt for CPX

## 2017-03-25 MED FILL — ADDERALL XR 30 MG CAP SA: 30 | 90 days supply | Qty: 90 | Fill #0

## 2017-03-25 MED FILL — DEXTROAMP-AMP 10 MG TAB: 10 | 90 days supply | Qty: 90 | Fill #0

## 2017-06-25 DIAGNOSIS — F900 Attention-deficit hyperactivity disorder, predominantly inattentive type: Secondary | ICD-10-CM | POA: Diagnosis not present

## 2017-06-25 DIAGNOSIS — F3341 Major depressive disorder, recurrent, in partial remission: Secondary | ICD-10-CM | POA: Diagnosis not present

## 2017-06-25 MED FILL — CITALOPRAM HBR 20 MG TABLET: 20 | 90 days supply | Qty: 90 | Fill #0

## 2017-06-25 MED FILL — DEXTROAMP-AMPHETAMIN 10 MG: 10 | 90 days supply | Qty: 90 | Fill #0

## 2017-06-25 MED FILL — ADDERALL XR 30 MG CAP SA: 30 | 90 days supply | Qty: 90 | Fill #0

## 2017-06-25 MED FILL — buPROPion HCL ER (XL) 300 M: 300 | 90 days supply | Qty: 90 | Fill #0

## 2017-06-25 MED FILL — clonazePAM 0.5 MG TABS: 0.5 | 30 days supply | Qty: 30 | Fill #0

## 2017-07-18 IMAGING — DX DG LUMBAR SPINE COMPLETE 4+V
5 series · 5 of 5 positions shown · non-contrast
Comparison: None

CLINICAL DATA: Low back pain with RIGHT radiculopathy for 1 month,
failure of conservative care

EXAM:
LUMBAR SPINE - COMPLETE 4+ VIEW

[l-spine ap]
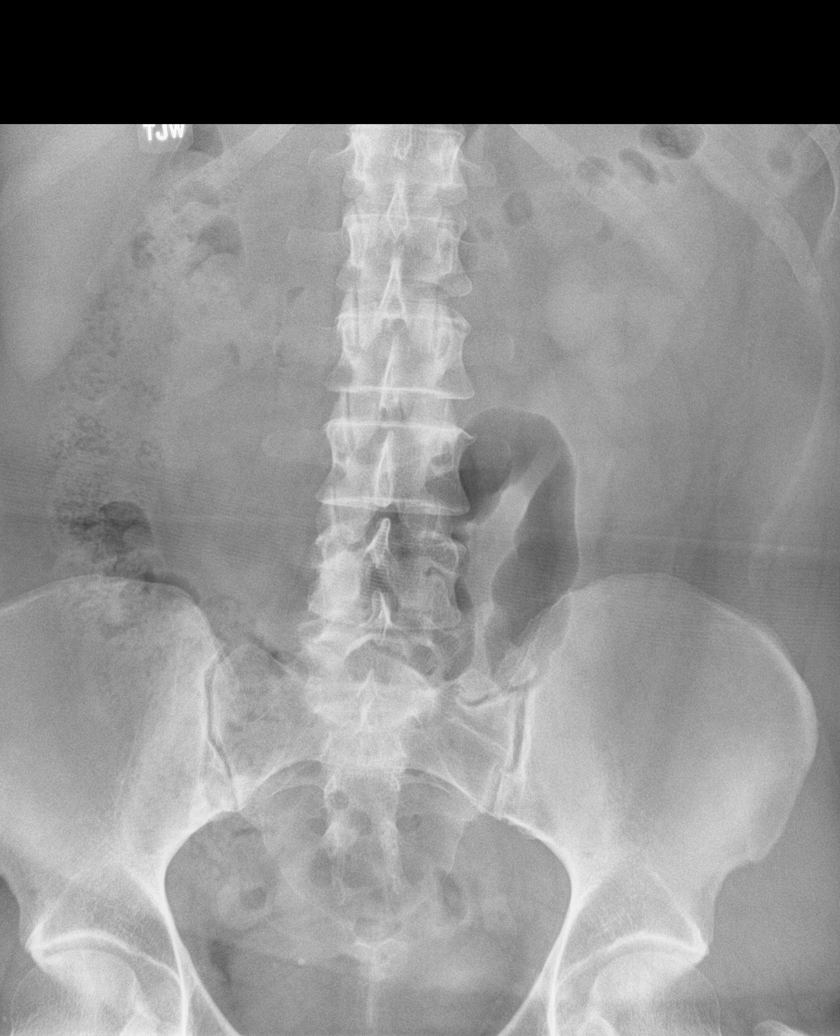

[l-spine obl (1 of 2)]
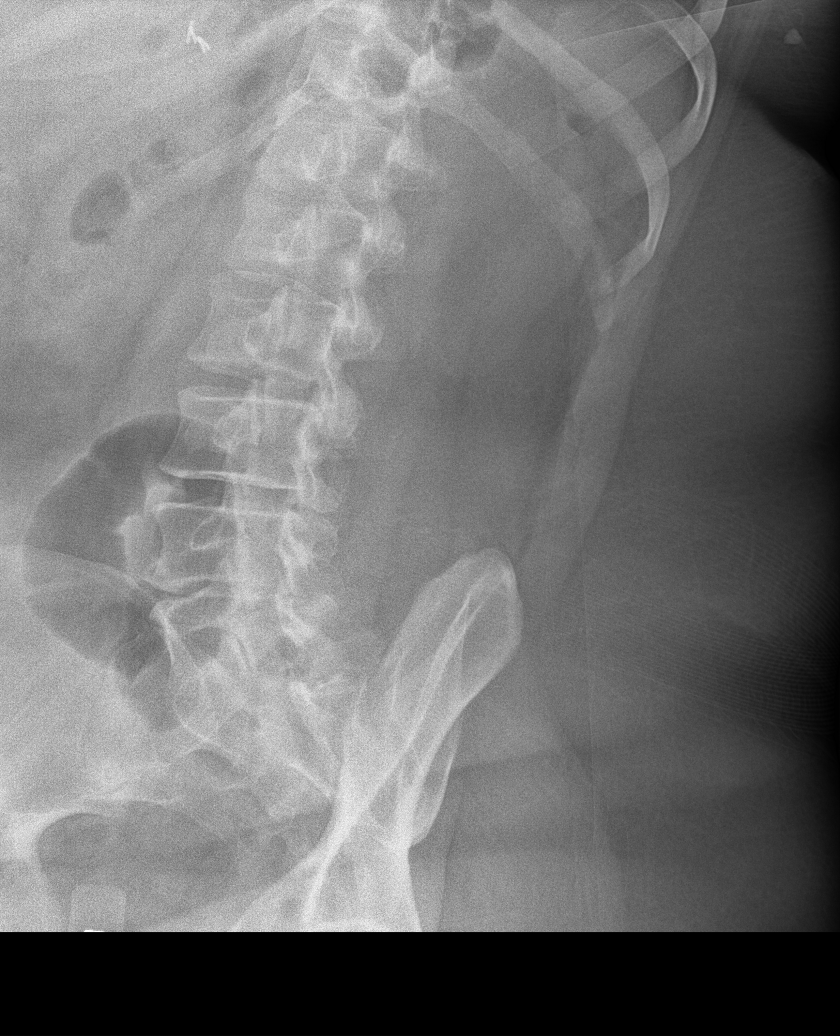

[l-spine obl (2 of 2)]
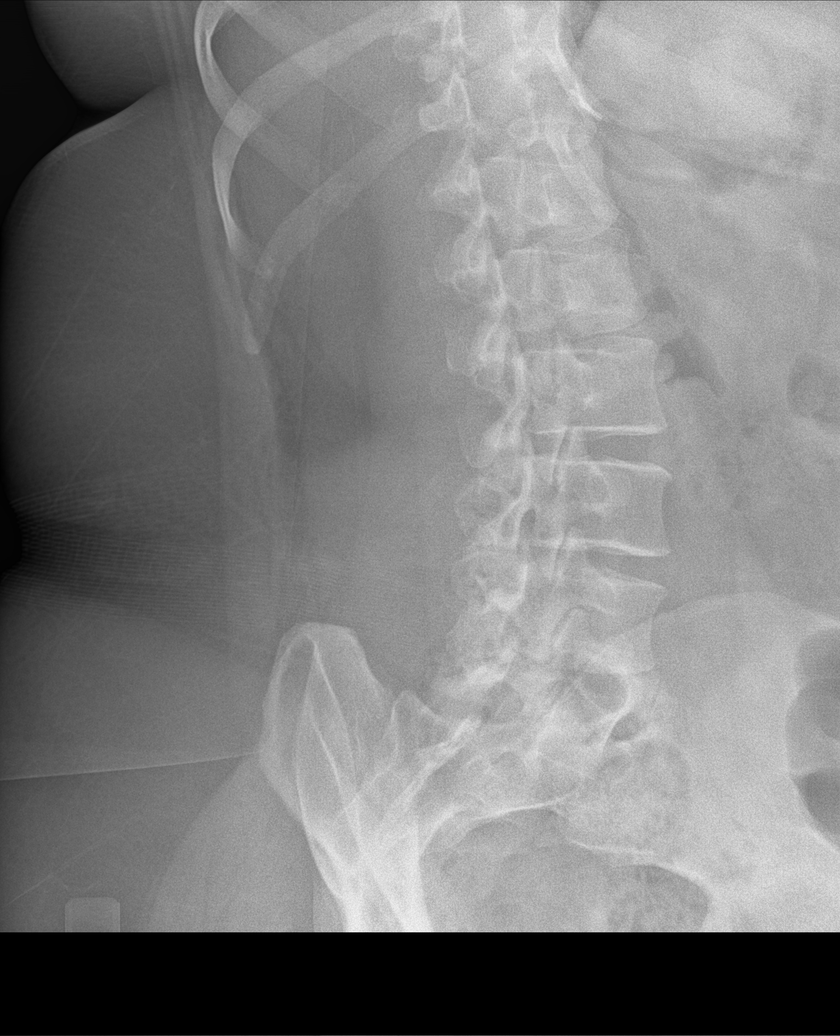

[l-spine lat]
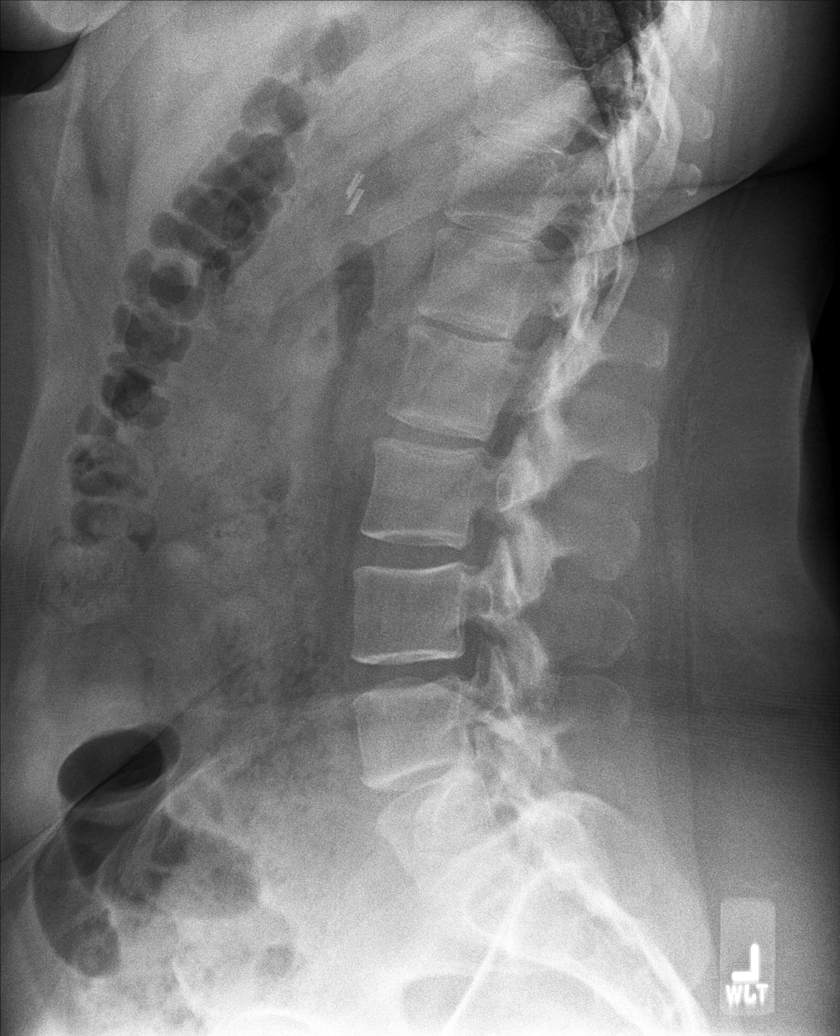

[l-spine l5/s1]
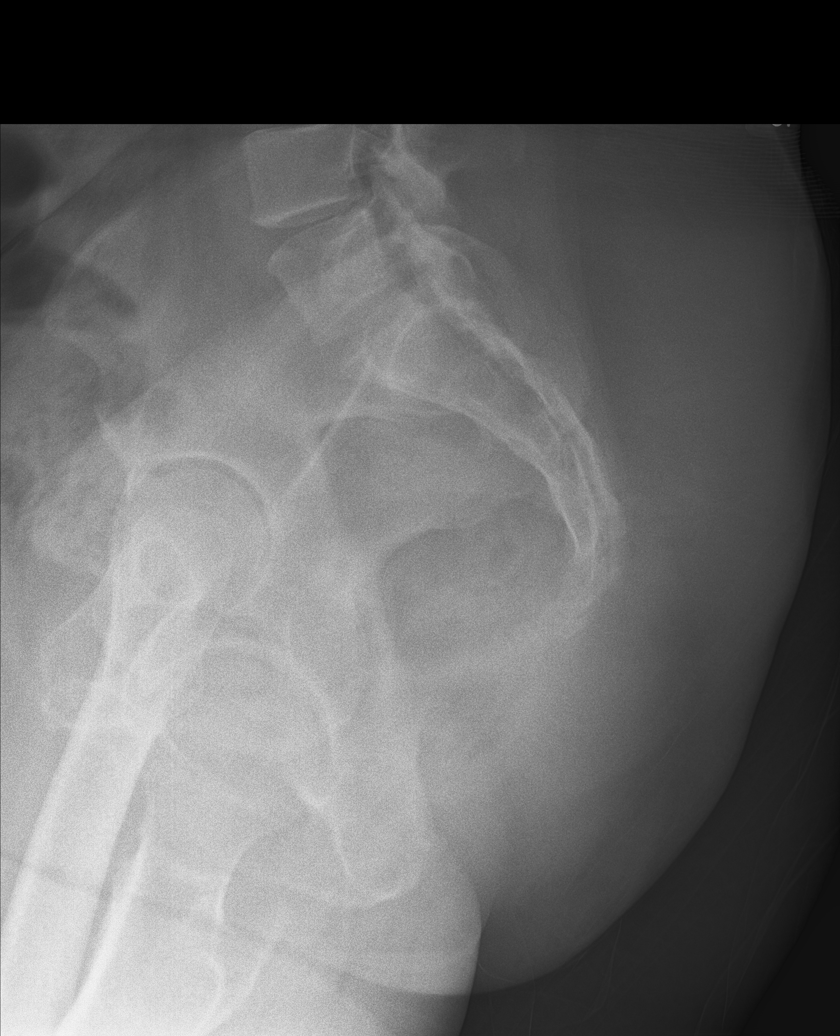

[5 of 5 positions shown; findings below may reference images not displayed]

FINDINGS: Six non-rib-bearing lumbar type vertebra with incompletely
lumbarized S1 segment.

Disc space narrowing L5-S1.

Vertebral body and disc space heights otherwise maintained.

No acute fracture, subluxation, or bone destruction.

No spondylolysis.

SI joints preserved.
IMPRESSION: Incompletely lumbarized S1 segment.

Mild degenerative disc disease changes L5-S1.

No acute bony abnormalities.

## 2017-10-16 MED FILL — ADDERALL XR 30 MG CAP SA: 30 | 30 days supply | Qty: 30 | Fill #0

## 2017-10-16 MED FILL — DEXTROAMP-AMP 10 MG TAB: 10 | 30 days supply | Qty: 30 | Fill #0

## 2018-01-01 DIAGNOSIS — F900 Attention-deficit hyperactivity disorder, predominantly inattentive type: Secondary | ICD-10-CM | POA: Diagnosis not present

## 2018-01-01 DIAGNOSIS — F3341 Major depressive disorder, recurrent, in partial remission: Secondary | ICD-10-CM | POA: Diagnosis not present

## 2018-01-01 MED FILL — DEXTROAMP-AMP 10 MG TAB: 10 | 30 days supply | Qty: 30 | Fill #0

## 2018-01-01 MED FILL — ADDERALL XR 30 MG CAP SA: 30 | 30 days supply | Qty: 30 | Fill #0

## 2018-01-07 ENCOUNTER — Ambulatory Visit: Payer: 59 | Admitting: Family Medicine

## 2018-01-07 ENCOUNTER — Encounter: Payer: Self-pay | Admitting: Family Medicine

## 2018-01-07 VITALS — BP 124/70 | HR 112 | Temp 97.9°F | Wt 264.0 lb

## 2018-01-07 DIAGNOSIS — J019 Acute sinusitis, unspecified: Secondary | ICD-10-CM

## 2018-01-07 DIAGNOSIS — J0101 Acute recurrent maxillary sinusitis: Secondary | ICD-10-CM | POA: Insufficient documentation

## 2018-01-07 MED ORDER — GUAIFENESIN-CODEINE 100-10 MG/5ML PO SYRP: 5.0000 mL | ORAL_SOLUTION | Freq: Two times a day (BID) | ORAL | 0 refills | Status: DC | PRN

## 2018-01-07 MED ORDER — AMOXICILLIN-POT CLAVULANATE 875-125 MG PO TABS: 1.0000 | ORAL_TABLET | Freq: Two times a day (BID) | ORAL | 0 refills | Status: AC

## 2018-01-07 NOTE — Assessment & Plan Note (Signed)
Will treat with augmentin given duration and progression of symptoms. cheratussin for cough. Reviewed further supportive care measures. Update if not improving with treatment.

## 2018-01-07 NOTE — Progress Notes (Signed)
BP 124/70 (BP Location: Left Arm, Patient Position: Sitting, Cuff Size: Large)   Pulse (!) 112   Temp 97.9 F (36.6 C) (Oral)   Wt 264 lb (119.7 kg)   LMP 03/27/2014   SpO2 96%   BMI 43.93 kg/m    CC: sinus congestion Subjective:    Patient ID: Patricia NiemannAmanda J Runk, female    DOB: 1971/02/23, [age] y.o.   MRN: [id number]  HPI: Patricia Bender is a 47 y.o. female presenting on 01/07/2018 for Sinus Problem (Sinus congestion, facial pain, chest congestion, throat irritation, cough and left side neck pain. Taking Claritin. Tried Dayquil/Nyquil. )   1+ wk h/o head > chest congestion, facial pain and pressure, dizziness, sore throat with PNDrainage, productive cough. L neck pain from ear downward. Has felt feverish.   No fevers/chills, tooth pain. No body aches or muscle aches.   Has tried claritin, dayquil, nyquil No sick contacts at home  Ex smoker quit 2017. No h/o asthma.   Relevant past medical, surgical, family and social history reviewed and updated as indicated. Interim medical history since our last visit reviewed. Allergies and medications reviewed and updated. Outpatient Medications Prior to Visit  Medication Sig Dispense Refill  . amphetamine-dextroamphetamine (ADDERALL XR) 30 MG 24 hr capsule Take 30 mg by mouth daily.    . citalopram (CELEXA) 20 MG tablet Take 20 mg by mouth daily.    Marland Kitchen. albuterol (PROVENTIL HFA;VENTOLIN HFA) 108 (90 Base) MCG/ACT inhaler Inhale 2 puffs into the lungs every 6 (six) hours as needed for wheezing or shortness of breath. 1 Inhaler 0  . HYDROcodone-acetaminophen (NORCO/VICODIN) 5-325 MG tablet Take 1 tablet by mouth every 6 (six) hours as needed for moderate pain. (Patient not taking: Reported on 09/25/2016) 20 tablet 0  . montelukast (SINGULAIR) 10 MG tablet TAKE 1 TABLET BY MOUTH ONCE DAILY AT BEDTIME 90 tablet 0  . predniSONE (DELTASONE) 20 MG tablet 2 tabs po for 5 days, then 1 tab po for 5 days (Patient not taking: Reported on 09/25/2016) 15  tablet 0   No facility-administered medications prior to visit.      Per HPI unless specifically indicated in ROS section below Review of Systems     Objective:    BP 124/70 (BP Location: Left Arm, Patient Position: Sitting, Cuff Size: Large)   Pulse (!) 112   Temp 97.9 F (36.6 C) (Oral)   Wt 264 lb (119.7 kg)   LMP 03/27/2014   SpO2 96%   BMI 43.93 kg/m   Wt Readings from Last 3 Encounters:  01/07/18 264 lb (119.7 kg)  09/19/16 258 lb 8 oz (117.3 kg)  08/29/16 256 lb 12 oz (116.5 kg)    Physical Exam  Constitutional: She appears well-developed and well-nourished. No distress.  HENT:  Head: Normocephalic and atraumatic.  Right Ear: Hearing, tympanic membrane, external ear and ear canal normal.  Left Ear: Hearing, tympanic membrane, external ear and ear canal normal.  Nose: Mucosal edema present. No rhinorrhea. Right sinus exhibits maxillary sinus tenderness. Right sinus exhibits no frontal sinus tenderness. Left sinus exhibits maxillary sinus tenderness. Left sinus exhibits no frontal sinus tenderness.  Mouth/Throat: Uvula is midline and mucous membranes are normal. Posterior oropharyngeal erythema present. No oropharyngeal exudate, posterior oropharyngeal edema or tonsillar abscesses.  Eyes: Conjunctivae and EOM are normal. Pupils are equal, round, and reactive to light. No scleral icterus.  Neck: Normal range of motion. Neck supple.  Cardiovascular: Normal rate, regular rhythm, normal heart sounds and intact distal pulses.  No murmur heard. Pulmonary/Chest: Effort normal and breath sounds normal. No respiratory distress. She has no wheezes. She has no rales.  Slightly coarse, productive cough present  Lymphadenopathy:    She has no cervical adenopathy.  Skin: Skin is warm and dry. No rash noted.  Nursing note and vitals reviewed.      Assessment & Plan:   Problem List Items Addressed This Visit    Acute sinusitis - Primary    Will treat with augmentin given  duration and progression of symptoms. cheratussin for cough. Reviewed further supportive care measures. Update if not improving with treatment.       Relevant Medications   amoxicillin-clavulanate (AUGMENTIN) 875-125 MG tablet   guaiFENesin-codeine (CHERATUSSIN AC) 100-10 MG/5ML syrup       Follow up plan: Return if symptoms worsen or fail to improve.  Eustaquio Boyden, MD

## 2018-01-07 NOTE — Patient Instructions (Signed)
You have a sinus infection. Take medicine as prescribed: augmentin antibiotic, codeine cough syrup Push fluids and plenty of rest. Nasal saline irrigation or neti pot to help drain sinuses. May use plain mucinex with plenty of fluid to help mobilize mucous. Please let us know if fever >101.5, trouble opening/closing mouth, difficulty swallowing, or worsening instead of improving as expected.

## 2018-01-29 MED FILL — BUPROPION HCL XL 300 MG TAB: 300 | 90 days supply | Qty: 90 | Fill #1

## 2018-01-29 MED FILL — DEXTROAMP-AMP 10 MG TAB: 10 | 30 days supply | Qty: 30 | Fill #0

## 2018-01-29 MED FILL — ADDERALL XR 30 MG CAP SA: 30 | 30 days supply | Qty: 30 | Fill #0

## 2018-01-29 MED FILL — clonazePAM 0.5 MG TABS: 0.5 | 30 days supply | Qty: 30 | Fill #0

## 2018-01-29 MED FILL — CITALOPRAM HBR 20 MG TABLET: 20 | 90 days supply | Qty: 90 | Fill #1

## 2018-03-05 MED FILL — DEXTROAMP-AMP 10 MG TAB: 10 | 30 days supply | Qty: 30 | Fill #0

## 2018-03-05 MED FILL — ADDERALL XR 30 MG CAP SA: 30 | 30 days supply | Qty: 30 | Fill #0

## 2018-04-15 MED FILL — DEXTROAMP-AMP 10 MG TAB: 10 | 90 days supply | Qty: 90 | Fill #0

## 2018-04-15 MED FILL — ADDERALL XR 30 MG CAP SA: 30 | 90 days supply | Qty: 90 | Fill #0

## 2018-07-08 DIAGNOSIS — F900 Attention-deficit hyperactivity disorder, predominantly inattentive type: Secondary | ICD-10-CM | POA: Diagnosis not present

## 2018-07-08 DIAGNOSIS — F3341 Major depressive disorder, recurrent, in partial remission: Secondary | ICD-10-CM | POA: Diagnosis not present

## 2018-07-22 MED FILL — ADDERALL XR 30 MG CAP SA: 30 | 90 days supply | Qty: 90 | Fill #0

## 2018-07-22 MED FILL — AMPHETAMINE-DEXTROAMPHETAMI: 10 | 90 days supply | Qty: 90 | Fill #0

## 2018-11-04 MED FILL — AMPHETAMINE-DEXTROAMPHETAMI: 10 | 90 days supply | Qty: 90 | Fill #0

## 2018-11-13 MED FILL — ADDERALL XR 30 MG CAP SA: 30 | 90 days supply | Qty: 90 | Fill #0

## 2018-11-28 ENCOUNTER — Ambulatory Visit (HOSPITAL_COMMUNITY)
Admission: EM | Admit: 2018-11-28 | Discharge: 2018-11-28 | Disposition: A | Discharge status: Home / Self Care | Payer: 59 | Attending: Family Medicine | Admitting: Family Medicine

## 2018-11-28 ENCOUNTER — Other Ambulatory Visit: Payer: Self-pay

## 2018-11-28 ENCOUNTER — Encounter (HOSPITAL_COMMUNITY): Payer: Self-pay | Admitting: Emergency Medicine

## 2018-11-28 DIAGNOSIS — J209 Acute bronchitis, unspecified: Secondary | ICD-10-CM

## 2018-11-28 MED ORDER — AMOXICILLIN-POT CLAVULANATE 875-125 MG PO TABS: 1.0000 | ORAL_TABLET | Freq: Two times a day (BID) | ORAL | 0 refills | Status: DC

## 2018-11-28 MED ORDER — IPRATROPIUM-ALBUTEROL 0.5-2.5 (3) MG/3ML IN SOLN
RESPIRATORY_TRACT | Status: AC
  Filled 2018-11-28: qty 3

## 2018-11-28 MED ORDER — PREDNISONE 10 MG PO TABS: 40.0000 mg | ORAL_TABLET | Freq: Every day | ORAL | 0 refills | Status: AC

## 2018-11-28 MED ORDER — ALBUTEROL SULFATE HFA 108 (90 BASE) MCG/ACT IN AERS: 1.0000 | INHALATION_SPRAY | Freq: Four times a day (QID) | RESPIRATORY_TRACT | 0 refills | Status: DC | PRN

## 2018-11-28 MED ORDER — IPRATROPIUM-ALBUTEROL 0.5-2.5 (3) MG/3ML IN SOLN
3.0000 mL | Freq: Once | RESPIRATORY_TRACT | Status: AC
  Administered 2018-11-28: 3 mL via RESPIRATORY_TRACT

## 2018-11-28 MED FILL — predniSONE 10 MG TABS: 10 | 5 days supply | Qty: 20 | Fill #0

## 2018-11-28 MED FILL — AMOX-CLAV 875-125 MG TABLET: 875-125 | 7 days supply | Qty: 14 | Fill #0

## 2018-11-28 MED FILL — VENTOLIN HFA 90 MCG INHALER: 108 (90 BAS | 25 days supply | Qty: 18 | Fill #0

## 2018-11-28 NOTE — Discharge Instructions (Signed)
We will go ahead and treat you for bronchitis and a lower respiratory tract infection. Augmentin twice daily for the next 7 days Prednisone 40 mg daily for the next 5 days Albuterol inhaler to use every 6 hours as needed for cough, wheezing, shortness of breath Follow up as needed for continued or worsening symptoms

## 2018-11-28 NOTE — ED Provider Notes (Addendum)
[facility]    CSN: [id number] Arrival date & time: 11/28/18  1001     History   Chief Complaint Chief Complaint  Patient presents with  . Sinus Problem    HPI DEEANNA BEIGHTOL is a 48 y.o. female.   Patient is a 48 year old female presents with upper respiratory symptoms for approximately 2 weeks.  Her symptoms started as sinus congestion, with nasal drainage and sinus pressure.  Since her symptoms have progressed to cough, chest congestion, mild shortness of breath, mild chest tightness.  She has heard some wheezing.  She has been coughing up small amounts of mucus.  She has been using Claritin, Flonase and Mucinex without much relief of symptoms.  She feels as if her symptoms are worsening.  She has had bronchitis in the past.  Reports multiple sick contacts in her house.  No recent traveling.  She is a former smoker.  ROS per HPI      Past Medical History:  Diagnosis Date  . ADHD (attention deficit hyperactivity disorder)   . Anemia    History  . Anxiety   . Depression   . Seasonal allergies   . SVD (spontaneous vaginal delivery)    x 4    Patient Active Problem List   Diagnosis Date Noted  . Acute sinusitis 01/07/2018  . Acute bronchitis 01/13/2015  . Menorrhagia 12/21/2014  . Pedal edema 10/13/2014  . Rash and nonspecific skin eruption 07/24/2013  . Reactive airways dysfunction syndrome (HCC) 04/13/2013  . Allergic rhinitis 04/13/2013  . ANXIETY STATE, UNSPECIFIED 01/04/2010  . Depression, major, recurrent, moderate (HCC) 09/08/2009  . TOBACCO ABUSE 07/04/2007    Past Surgical History:  Procedure Laterality Date  . BILATERAL SALPINGECTOMY Bilateral 12/21/2014   Procedure: BILATERAL SALPINGECTOMY;  Surgeon: Leslie Andrea, MD;  Location: [facility];  Service: Gynecology;  Laterality: Bilateral;  . CHOLECYSTECTOMY  03/2011  . LAPAROSCOPIC ASSISTED VAGINAL HYSTERECTOMY N/A 12/21/2014   Procedure: LAPAROSCOPIC ASSISTED VAGINAL HYSTERECTOMY  BILATERAL SALPINGECTOMY ;  Surgeon: Leslie Andrea, MD;  Location: [facility];  Service: Gynecology;  Laterality: N/A;  . LAPAROSCOPY N/A 05/19/2014   Procedure: LAPAROSCOPY DIAGNOSTIC;  Surgeon: Leslie Andrea, MD;  Location: [facility];  Service: Gynecology;  Laterality: N/A;  . TUBAL LIGATION    . WISDOM TOOTH EXTRACTION      OB History   No obstetric history on file.      Home Medications    Prior to Admission medications   Medication Sig Start Date End Date Taking? Authorizing Provider  amphetamine-dextroamphetamine (ADDERALL XR) 30 MG 24 hr capsule Take 30 mg by mouth daily.   Yes [provider]  citalopram (CELEXA) 20 MG tablet Take 20 mg by mouth daily.   Yes [provider]  albuterol (PROVENTIL HFA;VENTOLIN HFA) 108 (90 Base) MCG/ACT inhaler Inhale 1-2 puffs into the lungs every 6 (six) hours as needed for wheezing or shortness of breath. 11/28/18   Dahlia Byes A, NP  amoxicillin-clavulanate (AUGMENTIN) 875-125 MG tablet Take 1 tablet by mouth every 12 (twelve) hours. 11/28/18   Wataru Mccowen, Gloris Manchester A, NP  guaiFENesin-codeine (CHERATUSSIN AC) 100-10 MG/5ML syrup Take 5 mLs by mouth 2 (two) times daily as needed for cough (sedation precautions). 01/07/18   Eustaquio Boyden, MD  predniSONE (DELTASONE) 10 MG tablet Take 4 tablets (40 mg total) by mouth daily for 5 days. 11/28/18 12/03/18  Janace Aris, NP    Family History History reviewed. No pertinent family history.  Social  History Social History   Tobacco Use  . Smoking status: Former Smoker    Packs/day: 0.50    Years: 21.00    Pack years: 10.50    Types: Cigarettes    Last attempt to quit: 05/26/2016    Years since quitting: 2.5  . Smokeless tobacco: Never Used  Substance Use Topics  . Alcohol use: No    Alcohol/week: 0.0 standard drinks  . Drug use: No     Allergies   Patient has no known allergies.   Review of Systems Review of Systems  Constitutional: Positive for chills and fever.  HENT: Positive  for congestion, ear pain, rhinorrhea, sinus pressure, sinus pain and sore throat.   Respiratory: Positive for cough and wheezing. Negative for shortness of breath.   Cardiovascular: Negative for chest pain, palpitations and leg swelling.  Gastrointestinal: Negative for diarrhea, nausea and vomiting.  Skin: Negative for rash.     Physical Exam Triage Vital Signs ED Triage Vitals [11/28/18 1126]  Enc Vitals Group     BP 110/84     Pulse Rate 95     Resp      Temp 97.8 F (36.6 C)     Temp Source Oral     SpO2 97 %     Weight      Height      Head Circumference      Peak Flow      Pain Score 5     Pain Loc      Pain Edu?      Excl. in GC?    No data found.  Updated Vital Signs BP 110/84 (BP Location: Left Arm)   Pulse 95   Temp 97.8 F (36.6 C) (Oral)   LMP 03/27/2014   SpO2 97%   Visual Acuity Right Eye Distance:   Left Eye Distance:   Bilateral Distance:    Right Eye Near:   Left Eye Near:    Bilateral Near:     Physical Exam Vitals signs and nursing note reviewed.  Constitutional:      General: She is not in acute distress.    Appearance: Normal appearance. She is not ill-appearing, toxic-appearing or diaphoretic.  HENT:     Head: Normocephalic and atraumatic.     Right Ear: Tympanic membrane, ear canal and external ear normal.     Left Ear: Tympanic membrane, ear canal and external ear normal.     Nose: Congestion present.     Mouth/Throat:     Pharynx: Oropharynx is clear. No posterior oropharyngeal erythema.  Eyes:     Conjunctiva/sclera: Conjunctivae normal.  Neck:     Musculoskeletal: Normal range of motion.  Cardiovascular:     Rate and Rhythm: Normal rate and regular rhythm.     Heart sounds: Normal heart sounds.  Pulmonary:     Comments: Mild dyspnea.  Decreased lung sounds in all lung fields with mild expiratory wheezing. Musculoskeletal: Normal range of motion.  Lymphadenopathy:     Cervical: No cervical adenopathy.  Skin:    General:  Skin is warm and dry.  Neurological:     Mental Status: She is alert.  Psychiatric:        Mood and Affect: Mood normal.      UC Treatments / Results  Labs (all labs ordered are listed, but only abnormal results are displayed) Labs Reviewed - No data to display  EKG None  Radiology No results found.  Procedures Procedures (including critical care time)  Medications Ordered in UC Medications  ipratropium-albuterol (DUONEB) 0.5-2.5 (3) MG/3ML nebulizer solution 3 mL (3 mLs Nebulization Given 11/28/18 1150)    Initial Impression / Assessment and Plan / UC Course  I have reviewed the triage vital signs and the nursing notes.  Pertinent labs & imaging results that were available during my care of the patient were reviewed by me and considered in my medical decision making (see chart for details).    Lung sounds with wheezing and decreased lung sounds  Duo neb given in the clinic Lung sounds slightly improved.  Mild rhonchi present.  Will go ahead and treat for PNA and bronchitis Augmentin, prednisone and inhaler  She can continue the mucinex.  Follow up as needed for continued or worsening symptoms   Final Clinical Impressions(s) / UC Diagnoses   Final diagnoses:  Acute bronchitis, unspecified organism     Discharge Instructions     We will go ahead and treat you for bronchitis and a lower respiratory tract infection. Augmentin twice daily for the next 7 days Prednisone 40 mg daily for the next 5 days Albuterol inhaler to use every 6 hours as needed for cough, wheezing, shortness of breath Follow up as needed for continued or worsening symptoms     ED Prescriptions    Medication Sig Dispense Auth. Provider   amoxicillin-clavulanate (AUGMENTIN) 875-125 MG tablet Take 1 tablet by mouth every 12 (twelve) hours. 14 tablet Flornce Record A, NP   predniSONE (DELTASONE) 10 MG tablet Take 4 tablets (40 mg total) by mouth daily for 5 days. 20 tablet Isaiyah Feldhaus A, NP    albuterol (PROVENTIL HFA;VENTOLIN HFA) 108 (90 Base) MCG/ACT inhaler Inhale 1-2 puffs into the lungs every 6 (six) hours as needed for wheezing or shortness of breath. 1 Inhaler Dahlia ByesBast, Metzli Pollick A, NP     Controlled Substance Prescriptions [place] Controlled Substance Registry consulted? Not Applicable        Janace ArisBast, Joie Reamer A, NP 11/28/18 1328

## 2018-11-28 NOTE — ED Triage Notes (Signed)
Pt reports sinus issues for over two weeks.  Pt has been taking Claritan, Flonase and Mucinex.  Pt is concerned it has turned into bronchitis.

## 2018-12-31 DIAGNOSIS — F900 Attention-deficit hyperactivity disorder, predominantly inattentive type: Secondary | ICD-10-CM | POA: Diagnosis not present

## 2018-12-31 DIAGNOSIS — F3341 Major depressive disorder, recurrent, in partial remission: Secondary | ICD-10-CM | POA: Diagnosis not present

## 2018-12-31 MED FILL — CITALOPRAM HBR 20 MG TABLET: 20 | 90 days supply | Qty: 90 | Fill #0

## 2018-12-31 MED FILL — clonazePAM 0.5 MG TABS: 0.5 | 90 days supply | Qty: 90 | Fill #0

## 2018-12-31 MED FILL — buPROPion HCL ER (XL) 300 M: 300 | 90 days supply | Qty: 90 | Fill #0

## 2019-02-12 MED FILL — AMPHETAMINE-DEXTROAMPHETAMI: 10 | 90 days supply | Qty: 90 | Fill #0

## 2019-02-12 MED FILL — ADDERALL XR 30 MG CAP SA: 30 | 90 days supply | Qty: 90 | Fill #0

## 2019-05-28 MED FILL — AMPHETAMINE-DEXTROAMPHETAMI: 10 | 90 days supply | Qty: 90 | Fill #0

## 2019-05-28 MED FILL — ADDERALL XR 30 MG CAP SA: 30 | 90 days supply | Qty: 90 | Fill #0

## 2019-05-29 MED FILL — clonazePAM 0.5 MG TABS: 0.5 | 90 days supply | Qty: 90 | Fill #1

## 2019-07-07 DIAGNOSIS — F900 Attention-deficit hyperactivity disorder, predominantly inattentive type: Secondary | ICD-10-CM | POA: Diagnosis not present

## 2019-07-07 DIAGNOSIS — F3341 Major depressive disorder, recurrent, in partial remission: Secondary | ICD-10-CM | POA: Diagnosis not present

## 2019-08-24 MED FILL — ADDERALL XR 30 MG CAP SA: 30 | 90 days supply | Qty: 90 | Fill #0

## 2019-08-24 MED FILL — AMPHETAMINE-DEXTROAMPHETAMI: 10 | 90 days supply | Qty: 90 | Fill #0

## 2019-12-17 MED FILL — ADDERALL XR 30 MG CAP SA: 30 | 90 days supply | Qty: 90 | Fill #0

## 2019-12-17 MED FILL — AMPHETAMINE-DEXTROAMPHETAMI: 10 | 90 days supply | Qty: 90 | Fill #0

## 2020-01-27 MED FILL — BuPROPion HCL ER (XL) 300 M: 300 | 30 days supply | Qty: 30 | Fill #0

## 2020-01-27 MED FILL — CITALOPRAM HBR 20 MG TABLET: 20 | 90 days supply | Qty: 90 | Fill #0

## 2020-03-22 MED FILL — ADDERALL XR 30 MG CAP SA: 30 | 90 days supply | Qty: 90 | Fill #0

## 2020-03-22 MED FILL — AMPHETAMINE-DEXTROAMPHETAMI: 10 | 90 days supply | Qty: 90 | Fill #0

## 2020-03-24 MED FILL — CLINDAMYCIN HCL 150 MG CAPS: 150 | 7 days supply | Qty: 28 | Fill #0

## 2020-06-21 ENCOUNTER — Telehealth: Payer: Self-pay

## 2020-06-21 NOTE — Telephone Encounter (Signed)
[facility] Day - Client TELEPHONE ADVICE RECORD AccessNurse Patient Name: Patricia Bender Gender: Female DOB: 09-09-1971 Age: 49 Y 9 M 27 D Return Phone Number: [phone] (Primary) [place] [address] Client [facility] Day - Client Client Site [facility] - Day Physician Kerby Nora - MD Contact Type Call Who Is Calling Patient / Member / Family / Caregiver Call Type Triage / Clinical Relationship To Patient Self Return Phone Number [phone] (Primary) Chief Complaint Headache Reason for Call Symptomatic / Request for Health Information Initial Comment Caller states she has dizziness, headache and had vomiting a couple of days ago. GOTO Facility Not Listed [facility] Translation No Nurse Assessment Nurse: Alexander Mt, RN, Nicholaus Bloom Date/Time (Eastern Time): 06/21/2020 1:11:26 PM Confirm and document reason for call. If symptomatic, describe symptoms. ---Caller states she has had a headache and dizziness since saturday. She vomited on monday. right ear pain 6/10. Dizziness is worse with movement. no fever. States she went swimming on saturday. Has the patient had close contact with a person known or suspected to have the novel coronavirus illness OR traveled / lives in area with major community spread (including international travel) in the last 14 days from the onset of symptoms? * If Asymptomatic, screen for exposure and travel within the last 14 days. ---No Does the patient have any new or worsening symptoms? ---Yes Will a triage be completed? ---Yes Related visit to physician within the last 2 weeks? ---No Does the PT have any chronic conditions? (i.e. diabetes, asthma, this includes High risk factors for pregnancy, etc.) ---No Is the patient pregnant or possibly pregnant? (Ask all females between the ages of 70-55) ---No Is this a behavioral health or substance abuse  call? ---No Guidelines Guideline Title Affirmed Question Affirmed Notes Nurse Date/Time (Eastern Time) Dizziness - Vertigo Loss of vision or double vision (Exception: similar to previous migraines) Alexander Mt, RN, Nicholaus Bloom 06/21/2020 1:14:08 PM PLEASE NOTE: All timestamps contained within this report are represented as [place] Standard Time. CONFIDENTIALTY NOTICE: This fax transmission is intended only for the addressee. It contains information that is legally privileged, confidential or otherwise protected from use or disclosure. If you are not the intended recipient, you are strictly prohibited from reviewing, disclosing, copying using or disseminating any of this information or taking any action in reliance on or regarding this information. If you have received this fax in error, please notify us immediately by telephone so that we can arrange for its return to [place]. Phone: [phone], Toll-Free: [phone], Fax: [phone] Page: 2 of 2 Call Id: [id number] Disp. Time Lamount Cohen Time) Disposition Final User 06/21/2020 1:15:38 PM Go to ED Now (or PCP triage) Yes Alexander Mt, RN, Prentiss Bells Disagree/Comply Comply Caller Understands Yes PreDisposition Did not know what to do Care Advice Given Per Guideline GO TO ED NOW (OR PCP TRIAGE): * IF NO PCP (PRIMARY CARE PROVIDER) SECOND-LEVEL TRIAGE: You need to be seen within the next hour. Go to the ED/UCC at _____________ Hospital. Leave as soon as you can. ANOTHER ADULT SHOULD DRIVE: * It is better and safer if another adult drives instead of you. BRING MEDICINES: * Please bring a list of your current medicines when you go to the Emergency Department (ER). * It is also a good idea to bring the pill bottles too. This will help the doctor to make certain you are taking the right medicines and the right dose. CARE ADVICE given per Dizziness - Vertigo (Adult) guideline.  Referrals GO TO FACILITY OTHER - SPECIFY

## 2020-06-21 NOTE — Telephone Encounter (Signed)
I think it is not seen in > 3 years for new reestablish.. isn't it? I fine just having her follow up.

## 2020-06-21 NOTE — Telephone Encounter (Signed)
I spoke with Patricia Bender; Patricia Bender went to an UC and there was a 2 hr wait and Patricia Bender will go back to UC in early AM so there will not be a wait time. UC & ED precautions given and Patricia Bender voiced understanding. Patricia Bender last seen for acute on 01/07/2018 and last saw Dr Ermalene Searing for med refill visit on 12/07/2014. Will Patricia Bender need to reestablish care or not?

## 2020-06-23 ENCOUNTER — Other Ambulatory Visit: Payer: Self-pay

## 2020-06-23 ENCOUNTER — Encounter (HOSPITAL_COMMUNITY): Payer: Self-pay

## 2020-06-23 ENCOUNTER — Ambulatory Visit (HOSPITAL_COMMUNITY)
Admission: EM | Admit: 2020-06-23 | Discharge: 2020-06-23 | Disposition: A | Discharge status: Home / Self Care | Payer: No Typology Code available for payment source | Attending: Internal Medicine | Admitting: Internal Medicine

## 2020-06-23 DIAGNOSIS — G43109 Migraine with aura, not intractable, without status migrainosus: Secondary | ICD-10-CM | POA: Diagnosis not present

## 2020-06-23 DIAGNOSIS — J0110 Acute frontal sinusitis, unspecified: Secondary | ICD-10-CM | POA: Diagnosis not present

## 2020-06-23 MED ORDER — KETOROLAC TROMETHAMINE 30 MG/ML IJ SOLN
INTRAMUSCULAR | Status: AC
  Filled 2020-06-23: qty 1

## 2020-06-23 MED ORDER — FLUTICASONE PROPIONATE 50 MCG/ACT NA SUSP: 1.0000 | Freq: Every day | NASAL | 2 refills | Status: DC

## 2020-06-23 MED ORDER — SUMATRIPTAN SUCCINATE 6 MG/0.5ML ~~LOC~~ SOLN
6.0000 mg | Freq: Once | SUBCUTANEOUS | Status: AC
  Administered 2020-06-23: 6 mg via SUBCUTANEOUS

## 2020-06-23 MED ORDER — SUMATRIPTAN SUCCINATE 25 MG PO TABS: 25.0000 mg | ORAL_TABLET | Freq: Once | ORAL | 0 refills | Status: DC

## 2020-06-23 MED ORDER — SUMATRIPTAN SUCCINATE 6 MG/0.5ML ~~LOC~~ SOLN
SUBCUTANEOUS | Status: AC
  Filled 2020-06-23: qty 0.5

## 2020-06-23 MED ORDER — KETOROLAC TROMETHAMINE 30 MG/ML IJ SOLN
30.0000 mg | Freq: Once | INTRAMUSCULAR | Status: AC
  Administered 2020-06-23: 30 mg via INTRAMUSCULAR

## 2020-06-23 MED FILL — SUMATRIPTAN SUCC 25 MG TAB: 25 | 17 days supply | Qty: 10 | Fill #0

## 2020-06-23 MED FILL — FLUTICASONE PROP 50 MCG SPR: 50 | 60 days supply | Qty: 16 | Fill #0

## 2020-06-23 NOTE — ED Provider Notes (Signed)
[facility]    CSN: [id number] Arrival date & time: 06/23/20  6269      History   Chief Complaint Chief Complaint  Patient presents with  . Headache  . Blurred Vision  . Dizziness  . Otalgia    HPI Patricia Bender is a 49 y.o. female comes to the urgent care with complaints of right ear pain, blurry vision and frontal headache of 5 days duration.  Patient says symptoms started after she swam in the pool.  She denies any fever or chills.  Headache is frontal, worsened by bright lights and and relieved by dark quiet place.  Headache is associated with nausea and had one episode of vomiting. No weight loss, confusion.  Patient complains of nasal stuffiness.  No loss of taste or smell.  No sick contacts.  No nausea vomiting or diarrhea.  Patient has not been vaccinated against COVID-19 virus.  HPI  Past Medical History:  Diagnosis Date  . ADHD (attention deficit hyperactivity disorder)   . Anemia    History  . Anxiety   . Depression   . Seasonal allergies   . SVD (spontaneous vaginal delivery)    x 4    Patient Active Problem List   Diagnosis Date Noted  . Acute sinusitis 01/07/2018  . Acute bronchitis 01/13/2015  . Menorrhagia 12/21/2014  . Pedal edema 10/13/2014  . Rash and nonspecific skin eruption 07/24/2013  . Reactive airways dysfunction syndrome (HCC) 04/13/2013  . Allergic rhinitis 04/13/2013  . ANXIETY STATE, UNSPECIFIED 01/04/2010  . Depression, major, recurrent, moderate (HCC) 09/08/2009  . TOBACCO ABUSE 07/04/2007    Past Surgical History:  Procedure Laterality Date  . BILATERAL SALPINGECTOMY Bilateral 12/21/2014   Procedure: BILATERAL SALPINGECTOMY;  Surgeon: Leslie Andrea, MD;  Location: [facility];  Service: Gynecology;  Laterality: Bilateral;  . CHOLECYSTECTOMY  03/2011  . LAPAROSCOPIC ASSISTED VAGINAL HYSTERECTOMY N/A 12/21/2014   Procedure: LAPAROSCOPIC ASSISTED VAGINAL HYSTERECTOMY BILATERAL SALPINGECTOMY ;  Surgeon: Leslie Andrea, MD;  Location: [facility];  Service: Gynecology;  Laterality: N/A;  . LAPAROSCOPY N/A 05/19/2014   Procedure: LAPAROSCOPY DIAGNOSTIC;  Surgeon: Leslie Andrea, MD;  Location: [facility];  Service: Gynecology;  Laterality: N/A;  . TUBAL LIGATION    . WISDOM TOOTH EXTRACTION      OB History   No obstetric history on file.      Home Medications    Prior to Admission medications   Medication Sig Start Date End Date Taking? Authorizing Provider  amphetamine-dextroamphetamine (ADDERALL XR) 30 MG 24 hr capsule Take 30 mg by mouth daily.    [provider]  citalopram (CELEXA) 20 MG tablet Take 20 mg by mouth daily.    [provider]  fluticasone (FLONASE) 50 MCG/ACT nasal spray Place 1 spray into both nostrils daily for 7 days. 06/23/20 06/30/20  Merrilee Jansky, MD  SUMAtriptan (IMITREX) 25 MG tablet Take 1 tablet (25 mg total) by mouth once for 1 dose. May repeat in 2 hours if headache persists or recurs. 06/23/20 06/23/20  LampteyBritta Mccreedy, MD  albuterol (PROVENTIL HFA;VENTOLIN HFA) 108 (90 Base) MCG/ACT inhaler Inhale 1-2 puffs into the lungs every 6 (six) hours as needed for wheezing or shortness of breath. 11/28/18 06/23/20  Janace Aris, NP    Family History History reviewed. No pertinent family history.  Social History Social History   Tobacco Use  . Smoking status: Former Smoker    Packs/day: 0.50    Years:  21.00    Pack years: 10.50    Types: Cigarettes    Quit date: 05/26/2016    Years since quitting: 4.0  . Smokeless tobacco: Never Used  Substance Use Topics  . Alcohol use: No    Alcohol/week: 0.0 standard drinks  . Drug use: No     Allergies   Patient has no known allergies.   Review of Systems Review of Systems  Constitutional: Negative.   HENT: Positive for ear pain. Negative for facial swelling, hearing loss and postnasal drip.   Eyes: Negative.   Respiratory: Negative.   Cardiovascular: Negative.   Gastrointestinal: Negative.       Physical Exam Triage Vital Signs ED Triage Vitals  Enc Vitals Group     BP 06/23/20 0933 (!) 128/94     Pulse Rate 06/23/20 0933 90     Resp 06/23/20 0933 18     Temp 06/23/20 0933 97.8 F (36.6 C)     Temp Source 06/23/20 0933 Oral     SpO2 06/23/20 0933 99 %     Weight --      Height --      Head Circumference --      Peak Flow --      Pain Score 06/23/20 0932 4     Pain Loc --      Pain Edu? --      Excl. in GC? --    No data found.  Updated Vital Signs BP (!) 128/94 (BP Location: Left Arm)   Pulse 90   Temp 97.8 F (36.6 C) (Oral)   Resp 18   LMP 03/27/2014   SpO2 99%   Visual Acuity Right Eye Distance: 20/30 (With correction.) Left Eye Distance: 20/30 (With correction.) Bilateral Distance: 20/30 (With correction.)  Right Eye Near:   Left Eye Near:    Bilateral Near:     Physical Exam   UC Treatments / Results  Labs (all labs ordered are listed, but only abnormal results are displayed) Labs Reviewed - No data to display  EKG   Radiology No results found.  Procedures Procedures (including critical care time)  Medications Ordered in UC Medications  ketorolac (TORADOL) 30 MG/ML injection 30 mg (30 mg Intramuscular Given 06/23/20 1015)  SUMAtriptan (IMITREX) injection 6 mg (6 mg Subcutaneous Given 06/23/20 1014)    Initial Impression / Assessment and Plan / UC Course  I have reviewed the triage vital signs and the nursing notes.  Pertinent labs & imaging results that were available during my care of the patient were reviewed by me and considered in my medical decision making (see chart for details).     1.  Migraine with aura: Toradol 30 mg IM x1 dose Imitrex 6 mg subcu x1 dose Imitrex 25 mg orally at the outset of the headache Flonase If symptoms worsen patient is advised to return to urgent care to be reevaluated.  Patient verbalizes understanding. Final Clinical Impressions(s) / UC Diagnoses   Final diagnoses:  Migraine with aura  and without status migrainosus, not intractable  Acute non-recurrent frontal sinusitis   Discharge Instructions   None    ED Prescriptions    Medication Sig Dispense Auth. Provider   SUMAtriptan (IMITREX) 25 MG tablet Take 1 tablet (25 mg total) by mouth once for 1 dose. May repeat in 2 hours if headache persists or recurs. 10 tablet Jenicka Coxe, Britta Mccreedy, MD   fluticasone (FLONASE) 50 MCG/ACT nasal spray Place 1 spray into both nostrils daily for 7  days. 16 g Davan Nawabi, Britta Mccreedy, MD     PDMP not reviewed this encounter.   Merrilee Jansky, MD 06/23/20 1137

## 2020-06-23 NOTE — ED Triage Notes (Signed)
Pt reports dizziness, right ear pain, blurred vision and headache x 5 days after swimming. Denies fever, chills.

## 2020-06-24 NOTE — Telephone Encounter (Signed)
8/5  Pt aware

## 2020-06-24 NOTE — Telephone Encounter (Signed)
In office?

## 2020-06-24 NOTE — Telephone Encounter (Signed)
Do you want in office follow up or virtual??

## 2020-06-30 ENCOUNTER — Encounter: Payer: Self-pay | Admitting: Family Medicine

## 2020-06-30 ENCOUNTER — Ambulatory Visit (INDEPENDENT_AMBULATORY_CARE_PROVIDER_SITE_OTHER): Payer: No Typology Code available for payment source | Admitting: Family Medicine

## 2020-06-30 ENCOUNTER — Other Ambulatory Visit: Payer: Self-pay

## 2020-06-30 VITALS — BP 110/80 | HR 89 | Temp 98.0°F | Ht 65.0 in | Wt 270.5 lb

## 2020-06-30 DIAGNOSIS — J0101 Acute recurrent maxillary sinusitis: Secondary | ICD-10-CM | POA: Diagnosis not present

## 2020-06-30 MED ORDER — PREDNISONE 20 MG PO TABS: ORAL_TABLET | ORAL | 0 refills | Status: DC

## 2020-06-30 MED ORDER — AZITHROMYCIN 250 MG PO TABS: ORAL_TABLET | ORAL | 0 refills | Status: DC

## 2020-06-30 MED FILL — predniSONE 20 MG TABS: 20 | 7 days supply | Qty: 15 | Fill #0

## 2020-06-30 MED FILL — AZITHROMYCIN 250 MG TABS: 250 | 5 days supply | Qty: 6 | Fill #0

## 2020-06-30 NOTE — Assessment & Plan Note (Signed)
No history of migraine. NO clearly typical of migraine headache. Now ongoing since 7/24 off and on.  Treat with antibiotics to cover for bacterial component. Use prednisone and antihistamine to help with right ETD and sinus pressure.

## 2020-06-30 NOTE — Progress Notes (Signed)
Chief Complaint  Patient presents with  . Follow-up    [facility]-Migraines/Sinusitis    History of Present Illness: HPI    49 year old female presents for follow up migraine/sinusitits. She was seen at [facility] on 06/23/2020 with right ear pain, blurry vision and frontal headache. One day with associated nausea, no photophobia. Note reviewed in detail.  Dx with migraine.  Treated with:Toradol 30 mg IM x1 dose Imitrex 6 mg subcu x1 dose Imitrex 25 mg orally at the outset of the headache Flonase   She reports she had resolution of headache entirely. No further ear pain. 2 days ago headache returned, frontal pressure around eyes, bilateral ( no throbbing) Nasal congestion. Some post nasal drip  No N/V.  No photophobia.  No blurred vision.  No numbness or weakness.  No she reports right ear congestion, fullness.  No fever.  She has not had migraine in past. Has history of sinus infeciton and sinus issues.  She has been using the Claritin,  flonase.  This visit occurred during the SARS-CoV-2 public health emergency.  Safety protocols were in place, including screening questions prior to the visit, additional usage of staff PPE, and extensive cleaning of exam room while observing appropriate contact time as indicated for disinfecting solutions.   COVID 19 screen:  No recent travel or known exposure to COVID19 The patient denies respiratory symptoms of COVID 19 at this time. The importance of social distancing was discussed today.     ROS    Past Medical History:  Diagnosis Date  . ADHD (attention deficit hyperactivity disorder)   . Anemia    History  . Anxiety   . Depression   . Seasonal allergies   . SVD (spontaneous vaginal delivery)    x 4    reports that she has been smoking cigarettes. She has a 10.50 pack-year smoking history. She has never used smokeless tobacco. She reports that she does not drink alcohol and does not use drugs.   Current  Outpatient Medications:  .  amphetamine-dextroamphetamine (ADDERALL XR) 30 MG 24 hr capsule, Take 30 mg by mouth daily., Disp: , Rfl:  .  citalopram (CELEXA) 20 MG tablet, Take 20 mg by mouth daily., Disp: , Rfl:  .  fluticasone (FLONASE) 50 MCG/ACT nasal spray, Place 1 spray into both nostrils daily for 7 days., Disp: 16 g, Rfl: 2 .  SUMAtriptan (IMITREX) 25 MG tablet, Take 1 tablet (25 mg total) by mouth once for 1 dose. May repeat in 2 hours if headache persists or recurs., Disp: 10 tablet, Rfl: 0   Observations/Objective: Pulse 89, temperature 98 F (36.7 C), temperature source Temporal, height 5\' 5"  (1.651 m), weight 270 lb 8 oz (122.7 kg), last menstrual period 03/27/2014, SpO2 97 %.  Physical Exam Constitutional:      General: She is not in acute distress.    Appearance: Normal appearance. She is well-developed. She is not ill-appearing or toxic-appearing.  HENT:     Head: Normocephalic.     Right Ear: Hearing, ear canal and external ear normal. A middle ear effusion is present. There is no impacted cerumen. Tympanic membrane is not erythematous, retracted or bulging.     Left Ear: Hearing, tympanic membrane, ear canal and external ear normal.  No middle ear effusion. There is no impacted cerumen. Tympanic membrane is not erythematous, retracted or bulging.     Nose: No mucosal edema or rhinorrhea.     Right Turbinates: Swollen.  Left Turbinates: Swollen.     Right Sinus: No maxillary sinus tenderness or frontal sinus tenderness.     Left Sinus: No maxillary sinus tenderness or frontal sinus tenderness.     Mouth/Throat:     Lips: Pink.     Mouth: Mucous membranes are moist.     Tongue: No lesions.     Pharynx: Oropharynx is clear. Uvula midline.  Eyes:     General: Lids are normal. Lids are everted, no foreign bodies appreciated.     Conjunctiva/sclera: Conjunctivae normal.     Pupils: Pupils are equal, round, and reactive to light.  Neck:     Thyroid: No thyroid mass or  thyromegaly.     Vascular: No carotid bruit.     Trachea: Trachea normal.  Cardiovascular:     Rate and Rhythm: Normal rate and regular rhythm.     Pulses: Normal pulses.     Heart sounds: Normal heart sounds, S1 normal and S2 normal. No murmur heard.  No friction rub. No gallop.   Pulmonary:     Effort: Pulmonary effort is normal. No tachypnea or respiratory distress.     Breath sounds: Normal breath sounds. No decreased breath sounds, wheezing, rhonchi or rales.  Abdominal:     General: Bowel sounds are normal.     Palpations: Abdomen is soft.     Tenderness: There is no abdominal tenderness.  Musculoskeletal:     Cervical back: Normal range of motion and neck supple.  Skin:    General: Skin is warm and dry.     Findings: No rash.  Neurological:     Mental Status: She is alert.  Psychiatric:        Mood and Affect: Mood is not anxious or depressed.        Speech: Speech normal.        Behavior: Behavior normal. Behavior is cooperative.        Thought Content: Thought content normal.        Judgment: Judgment normal.      Assessment and Plan    No problem-specific Assessment & Plan notes found for this encounter.    Kerby Nora, MD

## 2020-06-30 NOTE — Patient Instructions (Signed)
Complete antibiotics and prednisone taper.  Hold flonase until completed.  Change claritin to zyrtec at bedtime.

## 2020-07-19 MED FILL — clonazePAM 0.5 MG TABS: 0.5 | 90 days supply | Qty: 90 | Fill #0

## 2020-07-28 MED FILL — ADDERALL XR 30 MG CAP SA: 30 | 90 days supply | Qty: 90 | Fill #0

## 2020-07-28 MED FILL — AMPHETAMINE SALTS 10 MG: 10 | 90 days supply | Qty: 90 | Fill #0

## 2020-11-30 ENCOUNTER — Other Ambulatory Visit (HOSPITAL_COMMUNITY): Payer: Self-pay | Admitting: *Deleted

## 2020-11-30 MED FILL — AMPHETAMINE SALTS 10 MG: 10 | 90 days supply | Qty: 90 | Fill #0

## 2020-11-30 MED FILL — ADDERALL XR 30 MG CAP SA: 30 | 90 days supply | Qty: 90 | Fill #0

## 2021-01-23 ENCOUNTER — Other Ambulatory Visit (HOSPITAL_COMMUNITY): Payer: Self-pay | Admitting: *Deleted

## 2021-01-23 MED FILL — CITALOPRAM HBR 20 MG TABLET: 20 | 90 days supply | Qty: 90 | Fill #0

## 2021-01-23 MED FILL — clonazePAM 0.5 MG TABS: 0.5 | 90 days supply | Qty: 90 | Fill #0

## 2021-01-23 MED FILL — buPROPion HCL ER (XL) 300 M: 300 | 90 days supply | Qty: 90 | Fill #0

## 2021-03-10 ENCOUNTER — Other Ambulatory Visit (HOSPITAL_COMMUNITY): Payer: Self-pay

## 2021-03-10 MED FILL — Clonazepam Tab 0.5 MG: ORAL | 90 days supply | Qty: 90 | Fill #0 | Status: AC

## 2021-03-10 MED FILL — Amphetamine-Dextroamphetamine Tab 10 MG: ORAL | 90 days supply | Qty: 90 | Fill #0 | Status: AC

## 2021-03-10 MED FILL — Amphetamine-Dextroamphetamine Cap ER 24HR 30 MG: ORAL | 90 days supply | Qty: 90 | Fill #0 | Status: AC

## 2021-06-14 ENCOUNTER — Other Ambulatory Visit (HOSPITAL_COMMUNITY): Payer: Self-pay

## 2021-06-19 ENCOUNTER — Other Ambulatory Visit (HOSPITAL_COMMUNITY): Payer: Self-pay

## 2021-06-20 ENCOUNTER — Other Ambulatory Visit (HOSPITAL_COMMUNITY): Payer: Self-pay

## 2021-06-21 ENCOUNTER — Other Ambulatory Visit (HOSPITAL_COMMUNITY): Payer: Self-pay

## 2021-06-21 MED ORDER — AMPHETAMINE-DEXTROAMPHET ER 30 MG PO CP24
ORAL_CAPSULE | Freq: Every day | ORAL | 0 refills | Status: DC
  Filled 2021-06-21: qty 90, 90d supply, fill #0

## 2021-06-21 MED ORDER — AMPHETAMINE-DEXTROAMPHETAMINE 10 MG PO TABS
ORAL_TABLET | ORAL | 0 refills | Status: DC
  Filled 2021-06-21: qty 90, 90d supply, fill #0

## 2021-09-28 ENCOUNTER — Other Ambulatory Visit (HOSPITAL_COMMUNITY): Payer: Self-pay

## 2021-09-28 MED ORDER — AMPHETAMINE-DEXTROAMPHET ER 30 MG PO CP24
ORAL_CAPSULE | Freq: Every day | ORAL | 0 refills | Status: DC
  Filled 2021-09-28: qty 90, 90d supply, fill #0

## 2021-09-28 MED ORDER — AMPHETAMINE-DEXTROAMPHETAMINE 10 MG PO TABS
ORAL_TABLET | ORAL | 0 refills | Status: DC
  Filled 2021-09-28: qty 90, 90d supply, fill #0

## 2021-11-29 ENCOUNTER — Other Ambulatory Visit (HOSPITAL_COMMUNITY): Payer: Self-pay

## 2021-11-29 MED ORDER — CITALOPRAM HYDROBROMIDE 20 MG PO TABS
20.0000 mg | ORAL_TABLET | Freq: Every day | ORAL | 1 refills | Status: DC
  Filled 2021-11-29: qty 90, 90d supply, fill #0

## 2021-11-29 MED ORDER — AMPHETAMINE-DEXTROAMPHETAMINE 10 MG PO TABS: 10.0000 mg | ORAL_TABLET | Freq: Every day | ORAL | 0 refills | Status: DC

## 2021-11-29 MED ORDER — CLONAZEPAM 0.5 MG PO TABS
0.5000 mg | ORAL_TABLET | Freq: Every day | ORAL | 1 refills | Status: DC | PRN
  Filled 2021-11-29: qty 90, 90d supply, fill #0

## 2021-11-29 MED ORDER — AMPHETAMINE-DEXTROAMPHET ER 30 MG PO CP24: 30.0000 mg | ORAL_CAPSULE | Freq: Every day | ORAL | 0 refills | Status: DC

## 2021-11-29 MED ORDER — BUPROPION HCL ER (XL) 300 MG PO TB24
300.0000 mg | ORAL_TABLET | Freq: Every day | ORAL | 1 refills | Status: DC
  Filled 2021-11-29: qty 90, 90d supply, fill #0

## 2021-12-08 ENCOUNTER — Other Ambulatory Visit (HOSPITAL_COMMUNITY): Payer: Self-pay

## 2022-01-15 ENCOUNTER — Other Ambulatory Visit (HOSPITAL_COMMUNITY): Payer: Self-pay

## 2022-01-17 ENCOUNTER — Other Ambulatory Visit (HOSPITAL_COMMUNITY): Payer: Self-pay

## 2022-01-17 ENCOUNTER — Other Ambulatory Visit: Payer: Self-pay

## 2022-01-17 ENCOUNTER — Encounter: Payer: Self-pay | Admitting: Internal Medicine

## 2022-01-17 ENCOUNTER — Ambulatory Visit (INDEPENDENT_AMBULATORY_CARE_PROVIDER_SITE_OTHER): Payer: No Typology Code available for payment source | Admitting: Internal Medicine

## 2022-01-17 DIAGNOSIS — J0140 Acute pansinusitis, unspecified: Secondary | ICD-10-CM | POA: Diagnosis not present

## 2022-01-17 MED ORDER — AMOXICILLIN-POT CLAVULANATE 875-125 MG PO TABS
1.0000 | ORAL_TABLET | Freq: Two times a day (BID) | ORAL | 0 refills | Status: DC
  Filled 2022-01-17: qty 14, 7d supply, fill #0

## 2022-01-17 NOTE — Progress Notes (Signed)
Subjective:    Patient ID: Patricia Bender, female    DOB: 1971/10/31, 51 y.o.   MRN: [id number]  HPI Here due to respiratory infection  Has facial pressure Lots of head congestion Some trouble breathing----like in chest Started a week ago---congestion Some cough, ear pain (better now) Occasional sputum---very thick Not much PND--since on claritin and mucinex  No fever now--did at first No chills or sweats  Current Outpatient Medications on File Prior to Visit  Medication Sig Dispense Refill   amphetamine-dextroamphetamine (ADDERALL XR) 30 MG 24 hr capsule Take 1 capsule (30 mg total) by mouth daily. 90 capsule 0   amphetamine-dextroamphetamine (ADDERALL) 10 MG tablet Take 1 tablet (10 mg total) by mouth daily at noon as needed 90 tablet 0   buPROPion (WELLBUTRIN XL) 300 MG 24 hr tablet Take 1 tablet (300 mg total) by mouth daily. 90 tablet 1   citalopram (CELEXA) 20 MG tablet TAKE 1 TABLET BY MOUTH AT BEDTIME 90 tablet 1   clonazePAM (KLONOPIN) 0.5 MG tablet TAKE 1 TABLET BY MOUTH AS NEEDED FOR AGITATION ONLY (90 DAY SUPPLY) 90 tablet 1   [DISCONTINUED] albuterol (PROVENTIL HFA;VENTOLIN HFA) 108 (90 Base) MCG/ACT inhaler Inhale 1-2 puffs into the lungs every 6 (six) hours as needed for wheezing or shortness of breath. 1 Inhaler 0   No current facility-administered medications on file prior to visit.    No Known Allergies  Past Medical History:  Diagnosis Date   ADHD (attention deficit hyperactivity disorder)    Anemia    History   Anxiety    Depression    Seasonal allergies    SVD (spontaneous vaginal delivery)    x 4    Past Surgical History:  Procedure Laterality Date   BILATERAL SALPINGECTOMY Bilateral 12/21/2014   Procedure: BILATERAL SALPINGECTOMY;  Surgeon: Leslie Andrea, MD;  Location: [facility];  Service: Gynecology;  Laterality: Bilateral;   CHOLECYSTECTOMY  03/2011   LAPAROSCOPIC ASSISTED VAGINAL HYSTERECTOMY N/A 12/21/2014   Procedure: LAPAROSCOPIC  ASSISTED VAGINAL HYSTERECTOMY BILATERAL SALPINGECTOMY ;  Surgeon: Leslie Andrea, MD;  Location: [facility];  Service: Gynecology;  Laterality: N/A;   LAPAROSCOPY N/A 05/19/2014   Procedure: LAPAROSCOPY DIAGNOSTIC;  Surgeon: Leslie Andrea, MD;  Location: [facility];  Service: Gynecology;  Laterality: N/A;   TUBAL LIGATION     WISDOM TOOTH EXTRACTION      No family history on file.  Social History   Socioeconomic History   Marital status: Married    Spouse name: Not on file   Number of children: 4   Years of education: Not on file   Highest education level: Not on file  Occupational History   Occupation: [employer]  Tobacco Use   Smoking status: Every Day    Packs/day: 0.50    Years: 21.00    Pack years: 10.50    Types: Cigarettes    Last attempt to quit: 05/26/2016    Years since quitting: 5.6   Smokeless tobacco: Never  Substance and Sexual Activity   Alcohol use: No    Alcohol/week: 0.0 standard drinks   Drug use: No   Sexual activity: Yes    Birth control/protection: Surgical  Other Topics Concern   Not on file  Social History Narrative   Works in Financial planner at [employer]   Social Determinants of Health   Financial Resource Strain: Not on file  Food Insecurity: Not on file  Transportation Needs: Not on file  Physical Activity: Not on  file  Stress: Not on file  Social Connections: Not on file  Intimate Partner Violence: Not on file   Review of Systems No N/V Some dizziness Eating okay Cough is affecting sleep    Objective:   Physical Exam Constitutional:      Appearance: Normal appearance.  HENT:     Head:     Comments: Maxillary and frontal tenderness    Right Ear: Tympanic membrane and ear canal normal.     Left Ear: Tympanic membrane and ear canal normal.     Nose:     Comments: Moderate nasal congestion    Mouth/Throat:     Pharynx: No oropharyngeal exudate or posterior oropharyngeal erythema.  Pulmonary:     Effort: Pulmonary effort is normal.      Breath sounds: Normal breath sounds. No wheezing or rales.  Musculoskeletal:     Cervical back: Neck supple.  Lymphadenopathy:     Cervical: No cervical adenopathy.  Neurological:     Mental Status: She is alert.           Assessment & Plan:

## 2022-01-17 NOTE — Assessment & Plan Note (Signed)
Has been sick for a week and has clear sinus symptoms Discussed supportive care--analgesics, flonase, etc Will give augmentin for 1 week If doesn't respond--will switch to doxy

## 2022-02-15 ENCOUNTER — Other Ambulatory Visit (HOSPITAL_COMMUNITY): Payer: Self-pay

## 2022-02-15 MED ORDER — AMPHETAMINE-DEXTROAMPHET ER 30 MG PO CP24
ORAL_CAPSULE | Freq: Every day | ORAL | 0 refills | Status: DC
  Filled 2022-02-15: qty 90, 90d supply, fill #0

## 2022-02-15 MED ORDER — AMPHETAMINE-DEXTROAMPHETAMINE 10 MG PO TABS
ORAL_TABLET | ORAL | 0 refills | Status: DC
  Filled 2022-02-15: qty 90, 90d supply, fill #0

## 2022-05-11 ENCOUNTER — Encounter: Payer: Self-pay | Admitting: Family Medicine

## 2022-05-11 ENCOUNTER — Ambulatory Visit (INDEPENDENT_AMBULATORY_CARE_PROVIDER_SITE_OTHER): Payer: No Typology Code available for payment source | Admitting: Family Medicine

## 2022-05-11 VITALS — BP 122/96 | HR 100 | Temp 98.7°F | Resp 16 | Ht 65.0 in | Wt 275.0 lb

## 2022-05-11 DIAGNOSIS — L989 Disorder of the skin and subcutaneous tissue, unspecified: Secondary | ICD-10-CM | POA: Diagnosis not present

## 2022-05-11 DIAGNOSIS — L918 Other hypertrophic disorders of the skin: Secondary | ICD-10-CM | POA: Diagnosis not present

## 2022-05-11 DIAGNOSIS — H1032 Unspecified acute conjunctivitis, left eye: Secondary | ICD-10-CM | POA: Diagnosis not present

## 2022-05-11 MED ORDER — OFLOXACIN 0.3 % OP SOLN: 1.0000 [drp] | Freq: Four times a day (QID) | OPHTHALMIC | 0 refills | Status: DC

## 2022-05-11 NOTE — Progress Notes (Signed)
Patient ID: Patricia Bender, female    DOB: [birth year], 51 y.o.   MRN: [id number]  This visit was conducted in person.  LMP 03/27/2014    CC:  Chief Complaint  Patient presents with   Conjunctivitis    X 2 week itches and burn when she wakes up there is crust in her eye.   Skin Tag    Back on the left side    Subjective:   HPI: Patricia Bender is a 51 y.o. female presenting on 05/11/2022 for Conjunctivitis (X 2 week itches and burn when she wakes up there is crust in her eye.) and Skin Tag (Back on the left side)    Conjunctivitis x 2 weeks: Itching and burning in her  left eye in AM's, irritated all day. Feels like foreign body sensation. Usually wearing contacts but has not been.. wearing glasses instead. Grand-kids with pink eye 2 weeks ago... stayed in hotel room with them.  No new cough and congestion.. has chronic allergies.    2.  Irritated skin tag on her back, left.. increasing in size. Rubbed with bra and skin fold.  No bleeding but dry and scaly at times.  3. Left leg.. skin lesion, dry flaky, present for 1 year. No bleeding, slight increase in size.       Relevant past medical, surgical, family and social history reviewed and updated as indicated. Interim medical history since our last visit reviewed. Allergies and medications reviewed and updated. Outpatient Medications Prior to Visit  Medication Sig Dispense Refill   amoxicillin-clavulanate (AUGMENTIN) 875-125 MG tablet Take 1 tablet by mouth 2 (two) times daily. 14 tablet 0   amphetamine-dextroamphetamine (ADDERALL XR) 30 MG 24 hr capsule Take 1 capsule (30 mg total) by mouth daily. 90 capsule 0   amphetamine-dextroamphetamine (ADDERALL XR) 30 MG 24 hr capsule Take 1 capsule by mouth daily 90 capsule 0   amphetamine-dextroamphetamine (ADDERALL) 10 MG tablet Take 1 tablet (10 mg total) by mouth daily at noon as needed 90 tablet 0   amphetamine-dextroamphetamine (ADDERALL) 10 MG tablet Take 1 tablet by  mouth daily at noon as needed 90 tablet 0   buPROPion (WELLBUTRIN XL) 300 MG 24 hr tablet Take 1 tablet (300 mg total) by mouth daily. 90 tablet 1   citalopram (CELEXA) 20 MG tablet TAKE 1 TABLET BY MOUTH AT BEDTIME 90 tablet 1   clonazePAM (KLONOPIN) 0.5 MG tablet TAKE 1 TABLET BY MOUTH AS NEEDED FOR AGITATION ONLY (90 DAY SUPPLY) 90 tablet 1   No facility-administered medications prior to visit.     Per HPI unless specifically indicated in ROS section below Review of Systems  Constitutional:  Negative for fatigue and fever.  HENT:  Negative for congestion.   Eyes:  Negative for pain.  Respiratory:  Negative for cough and shortness of breath.   Cardiovascular:  Negative for chest pain, palpitations and leg swelling.  Gastrointestinal:  Negative for abdominal pain.  Genitourinary:  Negative for dysuria and vaginal bleeding.  Musculoskeletal:  Negative for back pain.  Neurological:  Negative for syncope, light-headedness and headaches.  Psychiatric/Behavioral:  Negative for dysphoric mood.    Objective:  LMP 03/27/2014   Wt Readings from Last 3 Encounters:  01/17/22 270 lb (122.5 kg)  06/30/20 270 lb 8 oz (122.7 kg)  01/07/18 264 lb (119.7 kg)      Physical Exam Constitutional:      General: She is not in acute distress.    Appearance: Normal appearance. She  is well-developed. She is not ill-appearing or toxic-appearing.  HENT:     Head: Normocephalic.     Right Ear: Hearing, tympanic membrane, ear canal and external ear normal. Tympanic membrane is not erythematous, retracted or bulging.     Left Ear: Hearing, tympanic membrane, ear canal and external ear normal. Tympanic membrane is not erythematous, retracted or bulging.     Nose: No mucosal edema or rhinorrhea.     Right Sinus: No maxillary sinus tenderness or frontal sinus tenderness.     Left Sinus: No maxillary sinus tenderness or frontal sinus tenderness.     Mouth/Throat:     Pharynx: Uvula midline.  Eyes:      General: Lids are normal. Lids are everted, no foreign bodies appreciated.     Conjunctiva/sclera: Conjunctivae normal.     Pupils: Pupils are equal, round, and reactive to light.  Neck:     Thyroid: No thyroid mass or thyromegaly.     Vascular: No carotid bruit.     Trachea: Trachea normal.  Cardiovascular:     Rate and Rhythm: Normal rate and regular rhythm.     Pulses: Normal pulses.     Heart sounds: Normal heart sounds, S1 normal and S2 normal. No murmur heard.    No friction rub. No gallop.  Pulmonary:     Effort: Pulmonary effort is normal. No tachypnea or respiratory distress.     Breath sounds: Normal breath sounds. No decreased breath sounds, wheezing, rhonchi or rales.  Abdominal:     General: Bowel sounds are normal.     Palpations: Abdomen is soft.     Tenderness: There is no abdominal tenderness.  Musculoskeletal:     Cervical back: Normal range of motion and neck supple.  Skin:    General: Skin is warm and dry.     Findings: No rash.  Neurological:     Mental Status: She is alert.  Psychiatric:        Mood and Affect: Mood is not anxious or depressed.        Speech: Speech normal.        Behavior: Behavior normal. Behavior is cooperative.        Thought Content: Thought content normal.        Judgment: Judgment normal.        Left leg lesion    Results for orders placed or performed during the hospital encounter of 12/21/14  hCG, serum, qualitative  Result Value Ref Range   Preg, Serum NEGATIVE NEGATIVE  CBC  Result Value Ref Range   WBC 25.2 (H) 4.0 - 10.5 K/uL   RBC 4.34 3.87 - 5.11 MIL/uL   Hemoglobin 11.8 (L) 12.0 - 15.0 g/dL   HCT 21.1 (L) 94.1 - 74.0 %   MCV 82.5 78.0 - 100.0 fL   MCH 27.2 26.0 - 34.0 pg   MCHC 33.0 30.0 - 36.0 g/dL   RDW 81.4 48.1 - 85.6 %   Platelets 269 150 - 400 K/uL     COVID 19 screen:  No recent travel or known exposure to COVID19 The patient denies respiratory symptoms of COVID 19 at this time. The importance of  social distancing was discussed today.   Assessment and Plan     Kerby Nora, MD

## 2022-05-11 NOTE — Assessment & Plan Note (Signed)
Acute, possible viral infection but given greater than 2 weeks of symptoms will cover for bacterial infection.  No red flags such as eye pain or visual change.  If these occur she will have full ophthalmologic exam.

## 2022-05-11 NOTE — Patient Instructions (Signed)
Start eye drops for possible bacterial infection of eye.  If not improving  or if eye pain, vision change consider evaluation with ophthalmologist.  Skin tag on back  removed.Marland Kitchen apply topical antibiotic and bandaid to lesion.  Referral placed for full body skin exam and eval of lesion on left leg.

## 2022-05-11 NOTE — Assessment & Plan Note (Signed)
Procedure:  Skin tag removal. Consent obtained. Area cleaned with alcohol.  Cold spray used for anesthesia.  Skin tag removed with scissors.  Minimal bleeding and no complications.

## 2022-05-11 NOTE — Assessment & Plan Note (Signed)
Multiple solar lentigos on arms and chest.  There is an additional skin lesion on left mid leg that is likely seborrheic keratosis but given her mother's history of basal cell carcinoma I will refer her to dermatology for full body skin check and possible biopsy of this site.

## 2022-06-04 ENCOUNTER — Other Ambulatory Visit (HOSPITAL_COMMUNITY): Payer: Self-pay

## 2022-06-04 MED ORDER — AMPHETAMINE-DEXTROAMPHETAMINE 10 MG PO TABS
ORAL_TABLET | ORAL | 0 refills | Status: DC
  Filled 2022-06-04: qty 90, 90d supply, fill #0

## 2022-06-04 MED ORDER — BUPROPION HCL ER (XL) 300 MG PO TB24
ORAL_TABLET | ORAL | 1 refills | Status: DC
  Filled 2022-06-04: qty 90, 90d supply, fill #0

## 2022-06-04 MED ORDER — CITALOPRAM HYDROBROMIDE 20 MG PO TABS
20.0000 mg | ORAL_TABLET | Freq: Every day | ORAL | 1 refills | Status: DC
  Filled 2022-06-04: qty 90, 90d supply, fill #0

## 2022-06-04 MED ORDER — AMPHETAMINE-DEXTROAMPHET ER 30 MG PO CP24
ORAL_CAPSULE | Freq: Every day | ORAL | 0 refills | Status: DC
  Filled 2022-06-04: qty 90, 90d supply, fill #0

## 2022-08-29 ENCOUNTER — Other Ambulatory Visit (HOSPITAL_COMMUNITY): Payer: Self-pay

## 2022-08-29 ENCOUNTER — Ambulatory Visit (INDEPENDENT_AMBULATORY_CARE_PROVIDER_SITE_OTHER): Payer: No Typology Code available for payment source | Admitting: Family Medicine

## 2022-08-29 ENCOUNTER — Encounter: Payer: Self-pay | Admitting: Family Medicine

## 2022-08-29 VITALS — BP 106/80 | HR 74 | Temp 97.7°F | Ht 65.0 in | Wt 268.0 lb

## 2022-08-29 DIAGNOSIS — J209 Acute bronchitis, unspecified: Secondary | ICD-10-CM

## 2022-08-29 DIAGNOSIS — J683 Other acute and subacute respiratory conditions due to chemicals, gases, fumes and vapors: Secondary | ICD-10-CM

## 2022-08-29 MED ORDER — ALBUTEROL SULFATE HFA 108 (90 BASE) MCG/ACT IN AERS
1.0000 | INHALATION_SPRAY | Freq: Four times a day (QID) | RESPIRATORY_TRACT | 0 refills | Status: DC | PRN
  Filled 2022-08-29: qty 6.7, 25d supply, fill #0

## 2022-08-29 MED ORDER — PREDNISONE 20 MG PO TABS
ORAL_TABLET | ORAL | 0 refills | Status: AC
  Filled 2022-08-29: qty 15, 7d supply, fill #0

## 2022-08-29 MED ORDER — AZITHROMYCIN 250 MG PO TABS
ORAL_TABLET | ORAL | 0 refills | Status: DC
  Filled 2022-08-29: qty 6, 5d supply, fill #0

## 2022-08-29 MED ORDER — GUAIFENESIN-CODEINE 100-10 MG/5ML PO SYRP
5.0000 mL | ORAL_SOLUTION | Freq: Every evening | ORAL | 0 refills | Status: DC | PRN
  Filled 2022-08-29: qty 180, 18d supply, fill #0

## 2022-08-29 NOTE — Assessment & Plan Note (Addendum)
Acute Most likely initially viral but now with prolonged symptoms after 7 days, concerning for bacterial component.  COVID testing on day 5 was negative.  I will not repeat COVID testing in office today given she is past treatment time. Period.  Will treat with azithromycin. She likely has mild intermittent asthma/reactive airway disease.  She is currently very tight and with wheezing in her chest.  She will start albuterol 2 puffs every 4-6 hours as needed.  She will complete a prednisone taper and use a cough suppressant at night. Return and ER precautions provided in detail for patient.

## 2022-08-29 NOTE — Assessment & Plan Note (Signed)
She is seen pulmonary in the past Dr. Elsworth Soho.  Recommended treating as mild persistent asthma with flares.

## 2022-08-29 NOTE — Progress Notes (Signed)
Patient ID: Patricia Bender, female    DOB: 03-Apr-1971, 51 y.o.   MRN: [id number]  This visit was conducted in person.  BP 106/80 (BP Location: Left Arm, Patient Position: Sitting, Cuff Size: Normal)   Pulse 74   Temp 97.7 F (36.5 C) (Temporal)   Ht 5\' 5"  (1.651 m)   Wt 268 lb (121.6 kg)   LMP 03/27/2014   SpO2 94%   BMI 44.60 kg/m    CC:  Chief Complaint  Patient presents with   Acute Visit    Patient has had cough, HA, congestion and chest pain since 9/28. Patient has been taking zyrtec, Claritin, ibuprofen and tylenol for sx.    Subjective:   HPI: SATIN BOAL is a 51 y.o. female smoker with history of reactive airways, recurrent maxillary sinusitis/pansinusitis and allergic rhinitis presenting on 08/29/2022 for Acute Visit (Patient has had cough, HA, congestion and chest pain since 9/28. Patient has been taking zyrtec, Claritin, ibuprofen and tylenol for sx.)   Date of onset 08/23/2022  Initial symptoms: Started with ST, congestion.  She reports this has progressed to  productive cough (white sputum), headache, chest pain and back pain. No fever.   Bilateral upper chest pain  and upper back pain... constant.. feel soreness  Cough keeping her up at night.  She is SOB and wheezing.  Chronic lung disease: Reactive airway dysfunction syndrome but no specific diagnosis of asthma   Suing Claritin, zyrtec, tylenol , ibuprofen, flonase     Sick contacts: She was in 08/25/2022 last week, daughter sick.   COVID testing: home test negative on day 5 of symptoms.  Relevant past medical, surgical, family and social history reviewed and updated as indicated. Interim medical history since our last visit reviewed. Allergies and medications reviewed and updated. Outpatient Medications Prior to Visit  Medication Sig Dispense Refill   amphetamine-dextroamphetamine (ADDERALL XR) 30 MG 24 hr capsule Take 1 capsule by mouth daily 90 capsule 0   amphetamine-dextroamphetamine  (ADDERALL) 10 MG tablet Take 1 tablet by mouth every day at noon as needed 90 tablet 0   buPROPion (WELLBUTRIN XL) 300 MG 24 hr tablet Take 1 tablet by mouth daily. 90 tablet 1   citalopram (CELEXA) 20 MG tablet Take 1 tablet by mouth at bedtime. 90 tablet 1   clonazePAM (KLONOPIN) 0.5 MG tablet TAKE 1 TABLET BY MOUTH AS NEEDED FOR AGITATION ONLY (90 DAY SUPPLY) 90 tablet 1   ofloxacin (OCUFLOX) 0.3 % ophthalmic solution Place 1 drop into the left eye 4 (four) times daily. 5 mL 0   amphetamine-dextroamphetamine (ADDERALL) 10 MG tablet Take 1 tablet by mouth daily at noon as needed 90 tablet 0   buPROPion (WELLBUTRIN XL) 300 MG 24 hr tablet Take 1 tablet (300 mg total) by mouth daily. 90 tablet 1   amoxicillin-clavulanate (AUGMENTIN) 875-125 MG tablet Take 1 tablet by mouth 2 (two) times daily. (Patient not taking: Reported on 08/29/2022) 14 tablet 0   amphetamine-dextroamphetamine (ADDERALL XR) 30 MG 24 hr capsule Take 1 capsule (30 mg total) by mouth daily. (Patient not taking: Reported on 08/29/2022) 90 capsule 0   citalopram (CELEXA) 20 MG tablet TAKE 1 TABLET BY MOUTH AT BEDTIME 90 tablet 1   No facility-administered medications prior to visit.     Per HPI unless specifically indicated in ROS section below Review of Systems  Constitutional:  Positive for fatigue. Negative for fever.  HENT:  Positive for congestion.   Eyes:  Negative for pain.  Respiratory:  Positive for cough and shortness of breath.   Cardiovascular:  Positive for chest pain. Negative for palpitations and leg swelling.  Gastrointestinal:  Negative for abdominal pain.  Genitourinary:  Negative for dysuria and vaginal bleeding.  Musculoskeletal:  Negative for back pain.  Neurological:  Negative for syncope, light-headedness and headaches.  Psychiatric/Behavioral:  Negative for dysphoric mood.    Objective:  BP 106/80 (BP Location: Left Arm, Patient Position: Sitting, Cuff Size: Normal)   Pulse 74   Temp 97.7 F (36.5  C) (Temporal)   Ht 5\' 5"  (1.651 m)   Wt 268 lb (121.6 kg)   LMP 03/27/2014   SpO2 94%   BMI 44.60 kg/m   Wt Readings from Last 3 Encounters:  08/29/22 268 lb (121.6 kg)  05/11/22 275 lb (124.7 kg)  01/17/22 270 lb (122.5 kg)      Physical Exam Constitutional:      General: She is not in acute distress.    Appearance: Normal appearance. She is well-developed. She is not ill-appearing or toxic-appearing.  HENT:     Head: Normocephalic.     Right Ear: Hearing, tympanic membrane, ear canal and external ear normal. Tympanic membrane is not erythematous, retracted or bulging.     Left Ear: Hearing, tympanic membrane, ear canal and external ear normal. Tympanic membrane is not erythematous, retracted or bulging.     Nose: No mucosal edema or rhinorrhea.     Right Sinus: No maxillary sinus tenderness or frontal sinus tenderness.     Left Sinus: No maxillary sinus tenderness or frontal sinus tenderness.     Mouth/Throat:     Pharynx: Uvula midline.  Eyes:     General: Lids are normal. Lids are everted, no foreign bodies appreciated.     Conjunctiva/sclera: Conjunctivae normal.     Pupils: Pupils are equal, round, and reactive to light.  Neck:     Thyroid: No thyroid mass or thyromegaly.     Vascular: No carotid bruit.     Trachea: Trachea normal.  Cardiovascular:     Rate and Rhythm: Normal rate and regular rhythm.     Pulses: Normal pulses.     Heart sounds: Normal heart sounds, S1 normal and S2 normal. No murmur heard.    No friction rub. No gallop.  Pulmonary:     Effort: Pulmonary effort is normal. No tachypnea or respiratory distress.     Breath sounds: Decreased air movement present. Examination of the right-upper field reveals wheezing. Examination of the left-upper field reveals wheezing. Examination of the right-middle field reveals wheezing. Examination of the left-middle field reveals wheezing. Examination of the right-lower field reveals wheezing. Examination of the  left-lower field reveals wheezing. Wheezing present. No decreased breath sounds, rhonchi or rales.  Abdominal:     General: Bowel sounds are normal.     Palpations: Abdomen is soft.     Tenderness: There is no abdominal tenderness.  Musculoskeletal:     Cervical back: Normal range of motion and neck supple.  Skin:    General: Skin is warm and dry.     Findings: No rash.  Neurological:     Mental Status: She is alert.  Psychiatric:        Mood and Affect: Mood is not anxious or depressed.        Speech: Speech normal.        Behavior: Behavior normal. Behavior is cooperative.        Thought Content: Thought content normal.  Judgment: Judgment normal.       Results for orders placed or performed during the hospital encounter of 12/21/14  hCG, serum, qualitative  Result Value Ref Range   Preg, Serum NEGATIVE NEGATIVE  CBC  Result Value Ref Range   WBC 25.2 (H) 4.0 - 10.5 K/uL   RBC 4.34 3.87 - 5.11 MIL/uL   Hemoglobin 11.8 (L) 12.0 - 15.0 g/dL   HCT 88.4 (L) 16.6 - 06.3 %   MCV 82.5 78.0 - 100.0 fL   MCH 27.2 26.0 - 34.0 pg   MCHC 33.0 30.0 - 36.0 g/dL   RDW 01.6 01.0 - 93.2 %   Platelets 269 150 - 400 K/uL     COVID 19 screen:  No recent travel or known exposure to COVID19 The patient denies respiratory symptoms of COVID 19 at this time. The importance of social distancing was discussed today.   Assessment and Plan Problem List Items Addressed This Visit     Acute bronchitis    Acute Most likely initially viral but now with prolonged symptoms after 7 days, concerning for bacterial component.  COVID testing on day 5 was negative.  I will not repeat COVID testing in office today given she is past treatment time. Period.  Will treat with azithromycin. She likely has mild intermittent asthma/reactive airway disease.  She is currently very tight and with wheezing in her chest.  She will start albuterol 2 puffs every 4-6 hours as needed.  She will complete a prednisone  taper and use a cough suppressant at night. Return and ER precautions provided in detail for patient.      Reactive airways dysfunction syndrome (HCC) - Primary    She is seen pulmonary in the past Dr. Vassie Loll.  Recommended treating as mild persistent asthma with flares.          Kerby Nora, MD

## 2022-09-13 ENCOUNTER — Telehealth: Payer: Self-pay | Admitting: Family Medicine

## 2022-09-13 DIAGNOSIS — Z1159 Encounter for screening for other viral diseases: Secondary | ICD-10-CM

## 2022-09-13 DIAGNOSIS — Z13220 Encounter for screening for lipoid disorders: Secondary | ICD-10-CM

## 2022-09-13 NOTE — Telephone Encounter (Signed)
-----   Message from Ellamae Sia sent at 09/06/2022 11:16 AM EDT ----- Regarding: Lab orders for Thursday, 10.25.23 Patient is scheduled for CPX labs, please order future labs, Thanks , Karna Christmas

## 2022-09-20 ENCOUNTER — Other Ambulatory Visit: Payer: No Typology Code available for payment source

## 2022-09-20 ENCOUNTER — Other Ambulatory Visit (HOSPITAL_COMMUNITY): Payer: Self-pay

## 2022-09-20 MED ORDER — AMPHETAMINE-DEXTROAMPHETAMINE 10 MG PO TABS
ORAL_TABLET | ORAL | 0 refills | Status: DC
  Filled 2022-09-20: qty 90, 90d supply, fill #0

## 2022-09-20 MED ORDER — AMPHETAMINE-DEXTROAMPHET ER 30 MG PO CP24
30.0000 mg | ORAL_CAPSULE | Freq: Every day | ORAL | 0 refills | Status: DC
  Filled 2022-09-20: qty 90, 90d supply, fill #0

## 2022-09-28 ENCOUNTER — Encounter: Payer: No Typology Code available for payment source | Admitting: Family Medicine

## 2022-10-24 NOTE — Progress Notes (Unsigned)
    Liley Rake T. Jiyaan Steinhauser, MD, CAQ Sports Medicine [facility] [address] [place], [address]  Phone: [phone]  FAX: [phone]  Patricia Bender - 51 y.o. female  MRN [id number]  Date of Birth: [birth year]  Date: 10/25/2022  PCP: Excell Seltzer, MD  Referral: Excell Seltzer, MD  No chief complaint on file.  Subjective:   Patricia Bender is a 51 y.o. very pleasant female patient with There is no height or weight on file to calculate BMI. who presents with the following:  Pleasant young lady presents with sinus congestion, cough, and headache.    Review of Systems is noted in the HPI, as appropriate  Objective:   LMP 03/27/2014   GEN: No acute distress; alert,appropriate. PULM: Breathing comfortably in no respiratory distress PSYCH: Normally interactive.   Laboratory and Imaging Data:  Assessment and Plan:   ***

## 2022-10-25 ENCOUNTER — Ambulatory Visit (INDEPENDENT_AMBULATORY_CARE_PROVIDER_SITE_OTHER): Payer: No Typology Code available for payment source | Admitting: Family Medicine

## 2022-10-25 ENCOUNTER — Encounter: Payer: Self-pay | Admitting: Family Medicine

## 2022-10-25 VITALS — BP 116/78 | HR 102 | Temp 98.2°F | Ht 65.0 in | Wt 270.4 lb

## 2022-10-25 DIAGNOSIS — R062 Wheezing: Secondary | ICD-10-CM | POA: Diagnosis not present

## 2022-10-25 DIAGNOSIS — R0981 Nasal congestion: Secondary | ICD-10-CM

## 2022-10-25 DIAGNOSIS — J208 Acute bronchitis due to other specified organisms: Secondary | ICD-10-CM | POA: Diagnosis not present

## 2022-10-25 DIAGNOSIS — R051 Acute cough: Secondary | ICD-10-CM | POA: Diagnosis not present

## 2022-10-25 LAB — POC INFLUENZA A&B (BINAX/QUICKVUE)
Influenza A, POC: NEGATIVE
Influenza B, POC: NEGATIVE

## 2022-10-25 LAB — POC COVID19 BINAXNOW: SARS Coronavirus 2 Ag: NEGATIVE

## 2022-10-25 MED ORDER — DOXYCYCLINE HYCLATE 100 MG PO TABS: 100.0000 mg | ORAL_TABLET | Freq: Two times a day (BID) | ORAL | 0 refills | Status: DC

## 2022-10-25 MED ORDER — ALBUTEROL SULFATE HFA 108 (90 BASE) MCG/ACT IN AERS: 2.0000 | INHALATION_SPRAY | Freq: Four times a day (QID) | RESPIRATORY_TRACT | 0 refills | Status: DC | PRN

## 2022-10-25 MED ORDER — PREDNISONE 20 MG PO TABS: ORAL_TABLET | ORAL | 0 refills | Status: DC

## 2022-11-15 ENCOUNTER — Other Ambulatory Visit: Payer: Self-pay | Admitting: Family Medicine

## 2022-11-15 NOTE — Telephone Encounter (Signed)
Spoke to pt, scheduled cpe for 01/02/23

## 2022-11-15 NOTE — Telephone Encounter (Signed)
Please schedule CPE with fasting labs prior with Dr. Bedsole.  

## 2022-12-13 ENCOUNTER — Telehealth: Payer: Self-pay | Admitting: Family Medicine

## 2022-12-13 DIAGNOSIS — Z13220 Encounter for screening for lipoid disorders: Secondary | ICD-10-CM

## 2022-12-13 DIAGNOSIS — Z1159 Encounter for screening for other viral diseases: Secondary | ICD-10-CM

## 2022-12-13 NOTE — Telephone Encounter (Signed)
-----  Message from Velna Hatchet, RT sent at 12/11/2022 10:33 AM EST ----- Regarding: Wed 1/31 lab Patient has labs only appt on 12/26/22.  You have in an order for lipid, C met, and Hepatitis C which are all due to expire on 12/12/22.  Expiration date needs to be changed on orders if you still want these labs.  Thanks, Anda Kraft

## 2022-12-26 ENCOUNTER — Other Ambulatory Visit (INDEPENDENT_AMBULATORY_CARE_PROVIDER_SITE_OTHER): Payer: 59

## 2022-12-26 DIAGNOSIS — Z13220 Encounter for screening for lipoid disorders: Secondary | ICD-10-CM | POA: Diagnosis not present

## 2022-12-26 DIAGNOSIS — Z1159 Encounter for screening for other viral diseases: Secondary | ICD-10-CM

## 2022-12-26 LAB — COMPREHENSIVE METABOLIC PANEL
ALT: 25 U/L (ref 0–35)
AST: 20 U/L (ref 0–37)
Albumin: 4.4 g/dL (ref 3.5–5.2)
Alkaline Phosphatase: 81 U/L (ref 39–117)
BUN: 9 mg/dL (ref 6–23)
CO2: 28 mEq/L (ref 19–32)
Calcium: 9.4 mg/dL (ref 8.4–10.5)
Chloride: 102 mEq/L (ref 96–112)
Creatinine, Ser: 0.81 mg/dL (ref 0.40–1.20)
GFR: 85.29 mL/min (ref >60.00)
Glucose, Bld: 139 mg/dL — ABNORMAL HIGH (ref 70–99)
Potassium: 3.8 mEq/L (ref 3.5–5.1)
Sodium: 139 mEq/L (ref 135–145)
Total Bilirubin: 0.6 mg/dL (ref 0.2–1.2)
Total Protein: 7 g/dL (ref 6.0–8.3)

## 2022-12-26 LAB — LIPID PANEL
Cholesterol: 150 mg/dL (ref 0–200)
HDL: 54.2 mg/dL (ref >39.00)
LDL Cholesterol: 84 mg/dL (ref 0–99)
NonHDL: 95.49
Total CHOL/HDL Ratio: 3
Triglycerides: 57 mg/dL (ref 0.0–149.0)
VLDL: 11.4 mg/dL (ref 0.0–40.0)

## 2022-12-27 LAB — HEPATITIS C ANTIBODY: Hepatitis C Ab: NONREACTIVE

## 2022-12-27 NOTE — Progress Notes (Signed)
No critical labs need to be addressed urgently. We will discuss labs in detail at upcoming office visit.   

## 2023-01-02 ENCOUNTER — Encounter: Payer: Self-pay | Admitting: Family Medicine

## 2023-01-02 ENCOUNTER — Ambulatory Visit (INDEPENDENT_AMBULATORY_CARE_PROVIDER_SITE_OTHER): Payer: 59 | Admitting: Family Medicine

## 2023-01-02 VITALS — BP 110/70 | HR 92 | Temp 97.3°F | Ht 65.0 in | Wt 271.0 lb

## 2023-01-02 DIAGNOSIS — Z1211 Encounter for screening for malignant neoplasm of colon: Secondary | ICD-10-CM | POA: Diagnosis not present

## 2023-01-02 DIAGNOSIS — F331 Major depressive disorder, recurrent, moderate: Secondary | ICD-10-CM

## 2023-01-02 DIAGNOSIS — E119 Type 2 diabetes mellitus without complications: Secondary | ICD-10-CM | POA: Insufficient documentation

## 2023-01-02 DIAGNOSIS — R079 Chest pain, unspecified: Secondary | ICD-10-CM | POA: Insufficient documentation

## 2023-01-02 DIAGNOSIS — R7309 Other abnormal glucose: Secondary | ICD-10-CM

## 2023-01-02 DIAGNOSIS — Z23 Encounter for immunization: Secondary | ICD-10-CM

## 2023-01-02 DIAGNOSIS — F17200 Nicotine dependence, unspecified, uncomplicated: Secondary | ICD-10-CM

## 2023-01-02 DIAGNOSIS — Z0000 Encounter for general adult medical examination without abnormal findings: Secondary | ICD-10-CM | POA: Diagnosis not present

## 2023-01-02 LAB — POCT GLYCOSYLATED HEMOGLOBIN (HGB A1C): Hemoglobin A1C: 8.4 % — AB (ref 4.0–5.6)

## 2023-01-02 NOTE — Assessment & Plan Note (Signed)
Chronic, moderate control on Wellbutrin and Celexa, followed by psychiatry.  She does continue to mention poor motivation.

## 2023-01-02 NOTE — Patient Instructions (Addendum)
Quit smoking ! Send date you got the flu shot at work.  We will work on setting up colon cancer screening.  Work on low carbohydrate diet, stop soda, start regular walking.  Please call the location of your choice from the menu below to schedule your Mammogram and/or Bone Density appointment.    [facility]                      Phone:  [phone], [place] [address]                                                             Services: Traditional and 3D Mammogram, [place] [facility]                 Phone: [phone] [address], [place] [address]    Service: Bone Density ONLY   *this site does NOT perform mammograms  [facility]                        Phone:  [phone] [address]                                  [place], [place] [address]                                            Services:  3D Mammogram and Bone Density

## 2023-01-02 NOTE — Progress Notes (Signed)
Patient ID: Patricia Bender, female    DOB: [birth year], 52 y.o.   MRN: [id number]  This visit was conducted in person.  BP 110/70 (BP Location: Left Arm, Patient Position: Sitting, Cuff Size: Large)   Pulse 92   Temp (!) 97.3 F (36.3 C) (Temporal)   Ht 5\' 5"  (1.651 m)   Wt 271 lb (122.9 kg)   LMP 03/27/2014   SpO2 97%   BMI 45.10 kg/m    CC:  Chief Complaint  Patient presents with   Annual Exam    Subjective:   HPI: Patricia Bender is a 52 y.o. female presenting on 01/02/2023 for Annual Exam  The patient presents for complete physical and review of chronic health problems. She also has the following acute concerns today: She reports chest pain in the last 2 weeks associated with left arm tingling.  Few months ago apple watch state she was having Afib 2 %  2 weeks ago.. pain left upper chest wall, constant dull ... sharp pain with tingling is off and on. No change with eating or moving arm.  Always somewhat SOB from smoking. Has daily cough.   She has gained weight over last few years  Wt Readings from Last 3 Encounters:  01/02/23 271 lb (122.9 kg)  10/25/22 270 lb 6 oz (122.6 kg)  08/29/22 268 lb (121.6 kg)  Body mass index is 45.1 kg/m.    Reviewed labs in detail with the patient  Glucose elevated at 139.. had soda prior to labs.  Lab Results  Component Value Date   CHOL 150 12/26/2022   HDL 54.20 12/26/2022   LDLCALC 84 12/26/2022   TRIG 57.0 12/26/2022   CHOLHDL 3 12/26/2022  The 10-year ASCVD risk score (Arnett DK, et al., 2019) is: 3.3%   Values used to calculate the score:     Age: 47 years     Sex: Female     Is Non-Hispanic African American: No     Diabetic: Yes     Tobacco smoker: Yes     Systolic Blood Pressure: 532 mmHg     Is BP treated: No     HDL Cholesterol: 54.2 mg/dL     Total Cholesterol: 150 mg/dL  She does drink a lot of does, no exercise.   MDD, GAD    Followed by Donata Clay, PA at [facility].On wellbutrin and  celexa  ADD on adderall, well controlled. PHQ:9 GAD7: 6  Current smoker      Relevant past medical, surgical, family and social history reviewed and updated as indicated. Interim medical history since our last visit reviewed. Allergies and medications reviewed and updated. Outpatient Medications Prior to Visit  Medication Sig Dispense Refill   albuterol (VENTOLIN HFA) 108 (90 Base) MCG/ACT inhaler TAKE 2 PUFFS BY MOUTH EVERY 6 HOURS AS NEEDED FOR WHEEZE OR SHORTNESS OF BREATH 8.5 each 0   amphetamine-dextroamphetamine (ADDERALL XR) 30 MG 24 hr capsule Take 1 capsule (30 mg total) by mouth daily. 90 capsule 0   amphetamine-dextroamphetamine (ADDERALL) 10 MG tablet Take 1 tablet by mouth once daily at noon as needed 90 tablet 0   buPROPion (WELLBUTRIN XL) 300 MG 24 hr tablet Take 1 tablet by mouth daily. 90 tablet 1   citalopram (CELEXA) 20 MG tablet Take 1 tablet by mouth at bedtime. 90 tablet 1   clonazePAM (KLONOPIN) 0.5 MG tablet TAKE 1 TABLET BY MOUTH AS NEEDED FOR AGITATION ONLY (90 DAY SUPPLY) 90 tablet 1  amphetamine-dextroamphetamine (ADDERALL XR) 30 MG 24 hr capsule Take 1 capsule by mouth daily 90 capsule 0   amphetamine-dextroamphetamine (ADDERALL) 10 MG tablet Take 1 tablet by mouth every day at noon as needed 90 tablet 0   doxycycline (VIBRA-TABS) 100 MG tablet Take 1 tablet (100 mg total) by mouth 2 (two) times daily. 20 tablet 0   predniSONE (DELTASONE) 20 MG tablet 2 tabs po daily for 5 days, then 1 tab po daily for 5 days 15 tablet 0   No facility-administered medications prior to visit.     Per HPI unless specifically indicated in ROS section below Review of Systems  Constitutional:  Negative for fatigue and fever.  HENT:  Negative for congestion.   Eyes:  Negative for pain.  Respiratory:  Negative for cough and shortness of breath.   Cardiovascular:  Negative for chest pain, palpitations and leg swelling.  Gastrointestinal:  Negative for abdominal pain.   Genitourinary:  Negative for dysuria and vaginal bleeding.  Musculoskeletal:  Negative for back pain.  Neurological:  Negative for syncope, light-headedness and headaches.  Psychiatric/Behavioral:  Negative for dysphoric mood.     Objective:  BP 110/70 (BP Location: Left Arm, Patient Position: Sitting, Cuff Size: Large)   Pulse 92   Temp (!) 97.3 F (36.3 C) (Temporal)   Ht 5\' 5"  (1.651 m)   Wt 271 lb (122.9 kg)   LMP 03/27/2014   SpO2 97%   BMI 45.10 kg/m   Wt Readings from Last 3 Encounters:  01/02/23 271 lb (122.9 kg)  10/25/22 270 lb 6 oz (122.6 kg)  08/29/22 268 lb (121.6 kg)      Physical Exam Constitutional:      General: She is not in acute distress.    Appearance: Normal appearance. She is well-developed. She is obese. She is not ill-appearing or toxic-appearing.  HENT:     Head: Normocephalic.     Right Ear: Hearing, tympanic membrane, ear canal and external ear normal. Tympanic membrane is not erythematous, retracted or bulging.     Left Ear: Hearing, tympanic membrane, ear canal and external ear normal. Tympanic membrane is not erythematous, retracted or bulging.     Nose: No mucosal edema or rhinorrhea.     Right Sinus: No maxillary sinus tenderness or frontal sinus tenderness.     Left Sinus: No maxillary sinus tenderness or frontal sinus tenderness.     Mouth/Throat:     Pharynx: Uvula midline.  Eyes:     General: Lids are normal. Lids are everted, no foreign bodies appreciated.     Conjunctiva/sclera: Conjunctivae normal.     Pupils: Pupils are equal, round, and reactive to light.  Neck:     Thyroid: No thyroid mass or thyromegaly.     Vascular: No carotid bruit.     Trachea: Trachea normal.  Cardiovascular:     Rate and Rhythm: Normal rate and regular rhythm.     Pulses: Normal pulses.     Heart sounds: Normal heart sounds, S1 normal and S2 normal. No murmur heard.    No friction rub. No gallop.  Pulmonary:     Effort: Pulmonary effort is normal.  No tachypnea or respiratory distress.     Breath sounds: Normal breath sounds. No decreased breath sounds, wheezing, rhonchi or rales.  Chest:     Chest wall: Tenderness present.    Abdominal:     General: Bowel sounds are normal.     Palpations: Abdomen is soft.     Tenderness:  There is no abdominal tenderness.  Musculoskeletal:     Cervical back: Normal range of motion and neck supple.  Skin:    General: Skin is warm and dry.     Findings: No rash.  Neurological:     Mental Status: She is alert.  Psychiatric:        Mood and Affect: Mood is not anxious or depressed.        Speech: Speech normal.        Behavior: Behavior normal. Behavior is cooperative.        Thought Content: Thought content normal.        Judgment: Judgment normal.       Results for orders placed or performed in visit on 01/02/23  POCT glycosylated hemoglobin (Hb A1C)  Result Value Ref Range   Hemoglobin A1C 8.4 (A) 4.0 - 5.6 %   HbA1c POC (<> result, manual entry)     HbA1c, POC (prediabetic range)     HbA1c, POC (controlled diabetic range)      Assessment and Plan The patient's preventative maintenance and recommended screening tests for an annual wellness exam were reviewed in full today. Brought up to date unless services declined.  Counselled on the importance of diet, exercise, and its role in overall health and mortality. The patient's FH and SH was reviewed, including their home life, tobacco status, and drug and alcohol status.   Vaccines: Due for Tdap, and COVID. Had flu shot at work. Pap/DVE:  partial hysterectomy, no indication Mammo:  Due Colon: Due Smoking Status: Current, precontemplative for cessation ETOH/ drug use: none/none  Hep C:  done  HIV screen: Refused    Routine general medical examination at a health care facility  Colon cancer screening -     Ambulatory referral to Gastroenterology  Chest pain, unspecified type Assessment & Plan: Acute, most likely secondary  to chest wall pain/strain.  EKG showed normal sinus rhythm.    Orders: -     EKG 12-Lead  Elevated glucose -     POCT glycosylated hemoglobin (Hb A1C)  Need for Tdap vaccination -     Tdap vaccine greater than or equal to 7yo IM  Type 2 diabetes mellitus without complication, without long-term current use of insulin (HCC) Assessment & Plan: New diagnosis Poor diet and no exercise, she reports she has gained weight in the last year. We discussed diabetes diagnosis in detail including risk for complications, diet and lifestyle changes. She will start working aggressively on weight management, low-carb diet and regular exercise. Encouraged her to stop soda entirely.  Offered referral to nutritionist but she declined. Will reevaluate with A1c in 3 months.  If not at goal less than 7 at that time we will start a medication to treat such as metformin versus a GLP-1 medication   Depression, major, recurrent, moderate (HCC) Assessment & Plan: Chronic, moderate control on Wellbutrin and Celexa, followed by psychiatry.  She does continue to mention poor motivation.   TOBACCO ABUSE Assessment & Plan: Counseled on smoking cessation.  She is already on Wellbutrin which should decrease cravings somewhat.  She has quit successfully cold [place] in the past and will try this again.  Discussed increased risk for cardiovascular disease as well as lung cancer and multiple other health issues if she continues to smoke.      Return in about 3 months (around 04/02/2023) for diabetes follow up with POC A1C.   Eliezer Lofts, MD

## 2023-01-02 NOTE — Assessment & Plan Note (Signed)
New diagnosis Poor diet and no exercise, she reports she has gained weight in the last year. We discussed diabetes diagnosis in detail including risk for complications, diet and lifestyle changes. She will start working aggressively on weight management, low-carb diet and regular exercise. Encouraged her to stop soda entirely.  Offered referral to nutritionist but she declined. Will reevaluate with A1c in 3 months.  If not at goal less than 7 at that time we will start a medication to treat such as metformin versus a GLP-1 medication

## 2023-01-02 NOTE — Assessment & Plan Note (Signed)
Acute, most likely secondary to chest wall pain/strain.  EKG showed normal sinus rhythm.

## 2023-01-02 NOTE — Assessment & Plan Note (Signed)
Counseled on smoking cessation.  She is already on Wellbutrin which should decrease cravings somewhat.  She has quit successfully cold [place] in the past and will try this again.  Discussed increased risk for cardiovascular disease as well as lung cancer and multiple other health issues if she continues to smoke.

## 2023-01-18 ENCOUNTER — Other Ambulatory Visit (HOSPITAL_COMMUNITY): Payer: Self-pay

## 2023-01-18 DIAGNOSIS — F3341 Major depressive disorder, recurrent, in partial remission: Secondary | ICD-10-CM | POA: Diagnosis not present

## 2023-01-18 MED ORDER — CITALOPRAM HYDROBROMIDE 20 MG PO TABS
20.0000 mg | ORAL_TABLET | Freq: Every day | ORAL | 1 refills | Status: DC
  Filled 2023-01-18: qty 90, 90d supply, fill #0

## 2023-01-18 MED ORDER — AMPHETAMINE-DEXTROAMPHET ER 30 MG PO CP24
30.0000 mg | ORAL_CAPSULE | Freq: Every day | ORAL | 0 refills | Status: DC
  Filled 2023-01-18: qty 90, 90d supply, fill #0

## 2023-01-18 MED ORDER — BUPROPION HCL ER (XL) 300 MG PO TB24
300.0000 mg | ORAL_TABLET | Freq: Every day | ORAL | 1 refills | Status: DC
  Filled 2023-01-18: qty 90, 90d supply, fill #0

## 2023-01-18 MED ORDER — AMPHETAMINE-DEXTROAMPHETAMINE 10 MG PO TABS
10.0000 mg | ORAL_TABLET | Freq: Every day | ORAL | 0 refills | Status: DC
  Filled 2023-01-18: qty 90, 90d supply, fill #0

## 2023-05-28 ENCOUNTER — Other Ambulatory Visit (HOSPITAL_COMMUNITY): Payer: Self-pay

## 2023-05-28 MED ORDER — CLONAZEPAM 0.5 MG PO TABS
0.5000 mg | ORAL_TABLET | Freq: Every day | ORAL | 0 refills | Status: DC | PRN
  Filled 2023-05-28: qty 90, 90d supply, fill #0

## 2023-05-28 MED ORDER — AMPHETAMINE-DEXTROAMPHETAMINE 10 MG PO TABS
10.0000 mg | ORAL_TABLET | ORAL | 0 refills | Status: DC
  Filled 2023-05-28: qty 90, 90d supply, fill #0

## 2023-05-28 MED ORDER — AMPHETAMINE-DEXTROAMPHET ER 30 MG PO CP24
30.0000 mg | ORAL_CAPSULE | Freq: Every day | ORAL | 0 refills | Status: DC
  Filled 2023-05-28: qty 90, 90d supply, fill #0

## 2023-05-29 ENCOUNTER — Other Ambulatory Visit (HOSPITAL_COMMUNITY): Payer: Self-pay

## 2023-10-10 ENCOUNTER — Other Ambulatory Visit (HOSPITAL_COMMUNITY): Payer: Self-pay

## 2023-10-17 ENCOUNTER — Other Ambulatory Visit (HOSPITAL_COMMUNITY): Payer: Self-pay

## 2023-10-18 ENCOUNTER — Other Ambulatory Visit (HOSPITAL_COMMUNITY): Payer: Self-pay

## 2023-11-12 ENCOUNTER — Other Ambulatory Visit (HOSPITAL_COMMUNITY): Payer: Self-pay

## 2023-11-12 MED ORDER — AMPHETAMINE-DEXTROAMPHET ER 30 MG PO CP24
30.0000 mg | ORAL_CAPSULE | Freq: Every day | ORAL | 0 refills | Status: DC
  Filled 2023-11-12: qty 90, 90d supply, fill #0

## 2023-11-12 MED ORDER — AMPHETAMINE-DEXTROAMPHETAMINE 10 MG PO TABS
10.0000 mg | ORAL_TABLET | Freq: Every day | ORAL | 0 refills | Status: DC
  Filled 2023-11-12: qty 90, 90d supply, fill #0

## 2023-11-14 ENCOUNTER — Other Ambulatory Visit (HOSPITAL_COMMUNITY): Payer: Self-pay

## 2024-02-19 ENCOUNTER — Other Ambulatory Visit (HOSPITAL_COMMUNITY): Payer: Self-pay

## 2024-02-19 DIAGNOSIS — F3342 Major depressive disorder, recurrent, in full remission: Secondary | ICD-10-CM | POA: Diagnosis not present

## 2024-02-19 DIAGNOSIS — F3341 Major depressive disorder, recurrent, in partial remission: Secondary | ICD-10-CM | POA: Diagnosis not present

## 2024-02-19 DIAGNOSIS — F900 Attention-deficit hyperactivity disorder, predominantly inattentive type: Secondary | ICD-10-CM | POA: Diagnosis not present

## 2024-02-19 DIAGNOSIS — Z5181 Encounter for therapeutic drug level monitoring: Secondary | ICD-10-CM | POA: Diagnosis not present

## 2024-02-19 MED ORDER — CLONAZEPAM 0.5 MG PO TABS
0.5000 mg | ORAL_TABLET | Freq: Every day | ORAL | 1 refills | Status: AC | PRN
  Filled 2024-02-19: qty 90, 90d supply, fill #0

## 2024-02-19 MED ORDER — AMPHETAMINE-DEXTROAMPHETAMINE 10 MG PO TABS
10.0000 mg | ORAL_TABLET | Freq: Every day | ORAL | 0 refills | Status: AC
  Filled 2024-02-19: qty 90, 90d supply, fill #0

## 2024-02-19 MED ORDER — AMPHETAMINE-DEXTROAMPHET ER 30 MG PO CP24
30.0000 mg | ORAL_CAPSULE | Freq: Every day | ORAL | 0 refills | Status: AC
  Filled 2024-02-19: qty 90, 90d supply, fill #0

## 2024-02-19 MED ORDER — CITALOPRAM HYDROBROMIDE 20 MG PO TABS
20.0000 mg | ORAL_TABLET | Freq: Every day | ORAL | 1 refills | Status: AC
  Filled 2024-02-19: qty 90, 90d supply, fill #0

## 2024-02-19 MED ORDER — BUPROPION HCL ER (XL) 300 MG PO TB24
300.0000 mg | ORAL_TABLET | Freq: Every day | ORAL | 1 refills | Status: AC
  Filled 2024-02-19: qty 90, 90d supply, fill #0

## 2024-02-20 ENCOUNTER — Telehealth: Payer: Self-pay | Admitting: *Deleted

## 2024-02-20 DIAGNOSIS — E119 Type 2 diabetes mellitus without complications: Secondary | ICD-10-CM

## 2024-02-20 DIAGNOSIS — Z114 Encounter for screening for human immunodeficiency virus [HIV]: Secondary | ICD-10-CM

## 2024-02-20 NOTE — Telephone Encounter (Signed)
-----   Message from Alvina Chou sent at 02/20/2024 10:28 AM EDT ----- Regarding: Lab orders for Wed, 4.2.25 Patient is scheduled for CPX labs, please order future labs, Thanks , Camelia Eng

## 2024-02-24 LAB — HM DIABETES EYE EXAM

## 2024-02-26 ENCOUNTER — Other Ambulatory Visit (INDEPENDENT_AMBULATORY_CARE_PROVIDER_SITE_OTHER)

## 2024-02-26 ENCOUNTER — Encounter: Payer: Self-pay | Admitting: Family Medicine

## 2024-02-26 DIAGNOSIS — Z114 Encounter for screening for human immunodeficiency virus [HIV]: Secondary | ICD-10-CM | POA: Diagnosis not present

## 2024-02-26 DIAGNOSIS — E119 Type 2 diabetes mellitus without complications: Secondary | ICD-10-CM | POA: Diagnosis not present

## 2024-02-26 LAB — MICROALBUMIN / CREATININE URINE RATIO
Creatinine,U: 205.2 mg/dL
Microalb Creat Ratio: 4.8 mg/g (ref 0.0–30.0)
Microalb, Ur: 1 mg/dL (ref 0.0–1.9)

## 2024-02-26 LAB — COMPREHENSIVE METABOLIC PANEL WITH GFR
ALT: 27 U/L (ref 0–35)
AST: 18 U/L (ref 0–37)
Albumin: 4.6 g/dL (ref 3.5–5.2)
Alkaline Phosphatase: 93 U/L (ref 39–117)
BUN: 13 mg/dL (ref 6–23)
CO2: 29 meq/L (ref 19–32)
Calcium: 9.7 mg/dL (ref 8.4–10.5)
Chloride: 102 meq/L (ref 96–112)
Creatinine, Ser: 0.79 mg/dL (ref 0.40–1.20)
GFR: 87.17 mL/min (ref >60.00)
Glucose, Bld: 126 mg/dL — ABNORMAL HIGH (ref 70–99)
Potassium: 4.3 meq/L (ref 3.5–5.1)
Sodium: 139 meq/L (ref 135–145)
Total Bilirubin: 0.6 mg/dL (ref 0.2–1.2)
Total Protein: 7.3 g/dL (ref 6.0–8.3)

## 2024-02-26 LAB — LIPID PANEL
Cholesterol: 150 mg/dL (ref 0–200)
HDL: 58 mg/dL (ref >39.00)
LDL Cholesterol: 81 mg/dL (ref 0–99)
NonHDL: 92.43
Total CHOL/HDL Ratio: 3
Triglycerides: 56 mg/dL (ref 0.0–149.0)
VLDL: 11.2 mg/dL (ref 0.0–40.0)

## 2024-02-26 LAB — HEMOGLOBIN A1C: Hgb A1c MFr Bld: 7.2 % — ABNORMAL HIGH (ref 4.6–6.5)

## 2024-02-26 NOTE — Progress Notes (Signed)
 No critical labs need to be addressed urgently. We will discuss labs in detail at upcoming office visit.

## 2024-02-27 LAB — HIV ANTIBODY (ROUTINE TESTING W REFLEX): HIV 1&2 Ab, 4th Generation: NONREACTIVE

## 2024-03-04 ENCOUNTER — Ambulatory Visit (INDEPENDENT_AMBULATORY_CARE_PROVIDER_SITE_OTHER): Admitting: Family Medicine

## 2024-03-04 ENCOUNTER — Encounter: Payer: Self-pay | Admitting: Family Medicine

## 2024-03-04 ENCOUNTER — Other Ambulatory Visit (HOSPITAL_COMMUNITY): Payer: Self-pay

## 2024-03-04 VITALS — BP 128/70 | HR 90 | Temp 98.1°F | Ht 64.57 in | Wt 258.0 lb

## 2024-03-04 DIAGNOSIS — E119 Type 2 diabetes mellitus without complications: Secondary | ICD-10-CM

## 2024-03-04 DIAGNOSIS — Z0000 Encounter for general adult medical examination without abnormal findings: Secondary | ICD-10-CM | POA: Diagnosis not present

## 2024-03-04 DIAGNOSIS — F331 Major depressive disorder, recurrent, moderate: Secondary | ICD-10-CM

## 2024-03-04 DIAGNOSIS — Z7985 Long-term (current) use of injectable non-insulin antidiabetic drugs: Secondary | ICD-10-CM | POA: Diagnosis not present

## 2024-03-04 DIAGNOSIS — Z1211 Encounter for screening for malignant neoplasm of colon: Secondary | ICD-10-CM

## 2024-03-04 LAB — HM DIABETES FOOT EXAM

## 2024-03-04 MED ORDER — TIRZEPATIDE 2.5 MG/0.5ML ~~LOC~~ SOAJ
2.5000 mg | SUBCUTANEOUS | 3 refills | Status: DC
  Filled 2024-03-04: qty 2, 28d supply, fill #0

## 2024-03-04 MED ORDER — TIRZEPATIDE 2.5 MG/0.5ML ~~LOC~~ SOAJ
2.5000 mg | SUBCUTANEOUS | 3 refills | Status: DC
  Filled 2024-03-04: qty 2, 28d supply, fill #0
  Filled 2024-04-14: qty 2, 28d supply, fill #1

## 2024-03-04 NOTE — Patient Instructions (Addendum)
 Quit smoking!  Start Mounjaro injection  once a week.  Referred to [facility]. Please call the location of your choice from the menu below to schedule your Mammogram and/or Bone Density appointment.    [facility]   [facility]                      Phone:  [phone] [address]                               [place], [place] [address]                                                             Services: Traditional and 3D Mammogram, Bone Density   [facility]                 Phone: [phone] [address]                                                       [place], [place] [address]    Service: Bone Density ONLY   *this site does NOT perform mammograms  [facility]                        Phone:  [phone] [address]                                  [place], [place] [address]                                            Services:  3D Mammogram and Bone Density    [facility]   Phone:  [phone]   [address]                                                                            [place], [place] [address]                                            Services: 3D Mammogram and Bone [facility])  Phone:  [phone]   [address]                        [place], [place] [address]  Services:  3D Mammogram and Bone Density

## 2024-03-04 NOTE — Assessment & Plan Note (Signed)
Chronic, moderate control on Wellbutrin and Celexa, followed by psychiatry.  She does continue to mention poor motivation.

## 2024-03-04 NOTE — Progress Notes (Signed)
 Patient ID: Patricia Bender, female    DOB: 11/22/1971, 53 y.o.   MRN: [id number]  This visit was conducted in person.  BP 128/70   Pulse 90   Temp 98.1 F (36.7 C) (Oral)   Ht 5' 4.57" (1.64 m)   Wt 258 lb (117 kg)   LMP 03/27/2014   SpO2 93%   BMI 43.51 kg/m    CC:  Chief Complaint  Patient presents with   Annual Exam    Subjective:   HPI: Patricia Bender is a 53 y.o. female presenting on 03/04/2024 for Annual Exam  The patient presents for complete physical and review of chronic health problems. She also has the following acute concerns today:    Diabetes:   significant improvement in diet.. cut out sodas and eating more salads.  Has not tried other medications but has not reached A1C goal with aggressive diet changes and walking. Lab Results  Component Value Date   HGBA1C 7.2 (H) 02/26/2024  Using medications without difficulties: Hypoglycemic episodes: none Hyperglycemic episodes:  occ Feet problems: no ulcers Blood Sugars averaging:  rarely eye exam within last year: yes    She has  lost weight over last  year. Wt Readings from Last 3 Encounters:  03/04/24 258 lb (117 kg)  01/02/23 271 lb (122.9 kg)  10/25/22 270 lb 6 oz (122.6 kg)  Body mass index is 43.51 kg/m.  Reviewed labs in detail with the patient  Lab Results  Component Value Date   CHOL 150 02/26/2024   HDL 58.00 02/26/2024   LDLCALC 81 02/26/2024   TRIG 56.0 02/26/2024   CHOLHDL 3 02/26/2024  The 10-year ASCVD risk score (Arnett DK, et al., 2019) is: 4.3%   Values used to calculate the score:     Age: 52 years     Sex: Female     Is Non-Hispanic African American: No     Diabetic: Yes     Tobacco smoker: Yes     Systolic Blood Pressure: 128 mmHg     Is BP treated: No     HDL Cholesterol: 58 mg/dL     Total Cholesterol: 150 mg/dL  She does drink a lot of does, no exercise.   MDD, GAD    Followed by Lindaann Slough, PA at [facility].On wellbutrin and celexa  ADD on  adderall, well controlled.   Current smoker... had stopped but then started back      Relevant past medical, surgical, family and social history reviewed and updated as indicated. Interim medical history since our last visit reviewed. Allergies and medications reviewed and updated. Outpatient Medications Prior to Visit  Medication Sig Dispense Refill   albuterol (VENTOLIN HFA) 108 (90 Base) MCG/ACT inhaler TAKE 2 PUFFS BY MOUTH EVERY 6 HOURS AS NEEDED FOR WHEEZE OR SHORTNESS OF BREATH 8.5 each 0   amphetamine-dextroamphetamine (ADDERALL XR) 30 MG 24 hr capsule Take 1 capsule (30 mg total) by mouth daily. 90 capsule 0   amphetamine-dextroamphetamine (ADDERALL XR) 30 MG 24 hr capsule Take 1 capsule (30 mg total) by mouth daily. 90 capsule 0   amphetamine-dextroamphetamine (ADDERALL XR) 30 MG 24 hr capsule Take 1 capsule (30 mg total) by mouth daily. 90 capsule 0   amphetamine-dextroamphetamine (ADDERALL) 10 MG tablet Take 1 tablet by mouth once daily at noon as needed 90 tablet 0   amphetamine-dextroamphetamine (ADDERALL) 10 MG tablet Take 1 tablet (10 mg total) by mouth daily at noon as needed 90 tablet 0  amphetamine-dextroamphetamine (ADDERALL) 10 MG tablet Take 1 tablet (10 mg total) by mouth at noon as needed. 90 tablet 0   amphetamine-dextroamphetamine (ADDERALL) 10 MG tablet Take 1 tablet (10 mg total) by mouth daily at noon as needed 90 tablet 0   amphetamine-dextroamphetamine (ADDERALL) 10 MG tablet Take 1 tablet (10 mg total) by mouth daily at 12 noon as needed. 90 tablet 0   buPROPion (WELLBUTRIN XL) 300 MG 24 hr tablet Take 1 tablet by mouth daily. 90 tablet 1   buPROPion (WELLBUTRIN XL) 300 MG 24 hr tablet Take 1 tablet (300 mg total) by mouth daily. 90 tablet 1   buPROPion (WELLBUTRIN XL) 300 MG 24 hr tablet Take 1 tablet (300 mg total) by mouth daily. 90 tablet 1   citalopram (CELEXA) 20 MG tablet Take 1 tablet by mouth at bedtime. 90 tablet 1   citalopram (CELEXA) 20 MG tablet  Take 1 tablet by mouth at bedtime 90 tablet 1   citalopram (CELEXA) 20 MG tablet Take 1 tablet (20 mg total) by mouth at bedtime. 90 tablet 1   clonazePAM (KLONOPIN) 0.5 MG tablet Take 1 tablet (0.5 mg total) by mouth daily as needed for agitation only. 90 tablet 0   clonazePAM (KLONOPIN) 0.5 MG tablet Take 1 tablet (0.5 mg total) by mouth daily as needed for agitation only. 90 tablet 1   clonazePAM (KLONOPIN) 0.5 MG tablet TAKE 1 TABLET BY MOUTH AS NEEDED FOR AGITATION ONLY (90 DAY SUPPLY) 90 tablet 1   amphetamine-dextroamphetamine (ADDERALL XR) 30 MG 24 hr capsule Take 1 capsule (30 mg total) by mouth daily. 90 capsule 0   amphetamine-dextroamphetamine (ADDERALL XR) 30 MG 24 hr capsule Take 1 capsule (30 mg total) by mouth daily. 90 capsule 0   No facility-administered medications prior to visit.     Per HPI unless specifically indicated in ROS section below Review of Systems  Constitutional:  Negative for fatigue and fever.  HENT:  Negative for congestion.   Eyes:  Negative for pain.  Respiratory:  Negative for cough and shortness of breath.   Cardiovascular:  Negative for chest pain, palpitations and leg swelling.  Gastrointestinal:  Negative for abdominal pain.  Genitourinary:  Negative for dysuria and vaginal bleeding.  Musculoskeletal:  Negative for back pain.  Neurological:  Negative for syncope, light-headedness and headaches.  Psychiatric/Behavioral:  Negative for dysphoric mood.     Objective:  BP 128/70   Pulse 90   Temp 98.1 F (36.7 C) (Oral)   Ht 5' 4.57" (1.64 m)   Wt 258 lb (117 kg)   LMP 03/27/2014   SpO2 93%   BMI 43.51 kg/m   Wt Readings from Last 3 Encounters:  03/04/24 258 lb (117 kg)  01/02/23 271 lb (122.9 kg)  10/25/22 270 lb 6 oz (122.6 kg)      Physical Exam Constitutional:      General: She is not in acute distress.    Appearance: Normal appearance. She is well-developed. She is obese. She is not ill-appearing or toxic-appearing.  HENT:      Head: Normocephalic.     Right Ear: Hearing, tympanic membrane, ear canal and external ear normal. Tympanic membrane is not erythematous, retracted or bulging.     Left Ear: Hearing, tympanic membrane, ear canal and external ear normal. Tympanic membrane is not erythematous, retracted or bulging.     Nose: No mucosal edema or rhinorrhea.     Right Sinus: No maxillary sinus tenderness or frontal sinus tenderness.  Left Sinus: No maxillary sinus tenderness or frontal sinus tenderness.     Mouth/Throat:     Pharynx: Uvula midline.  Eyes:     General: Lids are normal. Lids are everted, no foreign bodies appreciated.     Conjunctiva/sclera: Conjunctivae normal.     Pupils: Pupils are equal, round, and reactive to light.  Neck:     Thyroid: No thyroid mass or thyromegaly.     Vascular: No carotid bruit.     Trachea: Trachea normal.  Cardiovascular:     Rate and Rhythm: Normal rate and regular rhythm.     Pulses: Normal pulses.     Heart sounds: Normal heart sounds, S1 normal and S2 normal. No murmur heard.    No friction rub. No gallop.  Pulmonary:     Effort: Pulmonary effort is normal. No tachypnea or respiratory distress.     Breath sounds: Normal breath sounds. No decreased breath sounds, wheezing, rhonchi or rales.  Chest:     Chest wall: No tenderness.  Abdominal:     General: Bowel sounds are normal.     Palpations: Abdomen is soft.     Tenderness: There is no abdominal tenderness.  Musculoskeletal:     Cervical back: Normal range of motion and neck supple.  Skin:    General: Skin is warm and dry.     Findings: No rash.  Neurological:     Mental Status: She is alert.  Psychiatric:        Mood and Affect: Mood is not anxious or depressed.        Speech: Speech normal.        Behavior: Behavior normal. Behavior is cooperative.        Thought Content: Thought content normal.        Judgment: Judgment normal.    Diabetic foot exam: Normal inspection No skin  breakdown No calluses  Normal DP pulses Normal sensation to light touch and monofilament Nails normal     Results for orders placed or performed in visit on 03/04/24  HM DIABETES FOOT EXAM   Collection Time: 03/04/24 12:00 AM  Result Value Ref Range   HM Diabetic Foot Exam done     Assessment and Plan The patient's preventative maintenance and recommended screening tests for an annual wellness exam were reviewed in full today. Brought up to date unless services declined.  Counselled on the importance of diet, exercise, and its role in overall health and mortality. The patient's FH and SH was reviewed, including their home life, tobacco status, and drug and alcohol status.   Vaccines: Due for Tdap, and COVID. Had flu shot at work. Pap/DVE:  partial hysterectomy, no indication Mammo:  Due, breast cancer Colon: Due, MGF with colon cancer Smoking Status: Current, precontemplative for cessation ETOH/ drug use: none/none  Hep C:  done  HIV screen: Refused    Routine general medical examination at a health care facility  Depression, major, recurrent, moderate (HCC) Assessment & Plan: Chronic, moderate control on Wellbutrin and Celexa, followed by psychiatry.  She does continue to mention poor motivation.   Type 2 diabetes mellitus without complication, without long-term current use of insulin (HCC) Assessment & Plan: Chronic, excellent significant improvement in control with diet changes  and weight loss.  A1c not at goal, will initiate Mounjaro 2.5 mg weekly for diabetes and weight management. No contraindications such as thyroid cancer history or pancreatitis. Reviewed possible medication side effects, expected course and mechanism. I discussed continued lifestyle changes  including low-carb diet and recommended regular exercise. Offered continuous glucose monitor but she is not interested.  Encouraged her to check fasting blood sugars at least every few days to help  determine if she is having improved control. She will follow-up in 1 month for reeval for possible increase in [place].   Colon cancer screening -     Ambulatory referral to Gastroenterology  Other orders -     Tirzepatide; Inject 2.5 mg into the skin once a week.  Dispense: 2 mL; Refill: 3      Return in about 4 weeks (around 04/01/2024) for  follow up Mounjaro, AND follow up DM with POC A1C in 3 months.Kerby Nora, MD

## 2024-03-04 NOTE — Assessment & Plan Note (Addendum)
 Chronic, excellent significant improvement in control with diet changes  and weight loss.  A1c not at goal, will initiate Mounjaro 2.5 mg weekly for diabetes and weight management. No contraindications such as thyroid cancer history or pancreatitis. Reviewed possible medication side effects, expected course and mechanism. I discussed continued lifestyle changes including low-carb diet and recommended regular exercise. Offered continuous glucose monitor but she is not interested.  Encouraged her to check fasting blood sugars at least every few days to help determine if she is having improved control. She will follow-up in 1 month for reeval for possible increase in [place].

## 2024-03-05 ENCOUNTER — Other Ambulatory Visit (HOSPITAL_COMMUNITY): Payer: Self-pay

## 2024-03-10 ENCOUNTER — Other Ambulatory Visit (HOSPITAL_COMMUNITY): Payer: Self-pay

## 2024-03-13 ENCOUNTER — Encounter: Payer: Self-pay | Admitting: Family Medicine

## 2024-03-14 ENCOUNTER — Other Ambulatory Visit (HOSPITAL_COMMUNITY): Payer: Self-pay

## 2024-03-16 ENCOUNTER — Other Ambulatory Visit (HOSPITAL_COMMUNITY): Payer: Self-pay

## 2024-03-16 ENCOUNTER — Encounter: Payer: Self-pay | Admitting: *Deleted

## 2024-03-16 ENCOUNTER — Telehealth: Payer: Self-pay | Admitting: Pharmacy Technician

## 2024-03-16 NOTE — Telephone Encounter (Signed)
 Pharmacy Patient Advocate Encounter  Received notification from [facility] that Prior Authorization for Mounjaro  2.5MG /0.5ML auto-injectors has been APPROVED from 03/16/24 to 03/16/25. Ran test claim, Copay is $25.00. This test claim was processed through [facility]- copay amounts may vary at other pharmacies due to pharmacy/plan contracts, or as the patient moves through the different stages of their insurance plan.   PA #/Case ID/Reference #: [id number]

## 2024-03-16 NOTE — Telephone Encounter (Signed)
 PA request has been Approved. New Encounter has been or will be created for follow up. For additional info see Pharmacy Prior Auth telephone encounter from 03/16/2024.

## 2024-03-16 NOTE — Telephone Encounter (Signed)
 Pharmacy Patient Advocate Encounter   Received notification from Patient Advice Request messages that prior authorization for Mounjaro  2.5MG /0.5ML auto-injectors is required/requested.   Insurance verification completed.   The patient is insured through [facility] .   Per test claim: PA required; PA submitted to above mentioned insurance via CoverMyMeds Key/confirmation #/EOC [id number] Status is pending

## 2024-04-16 ENCOUNTER — Encounter: Payer: Self-pay | Admitting: Family Medicine

## 2024-04-16 ENCOUNTER — Other Ambulatory Visit (HOSPITAL_COMMUNITY): Payer: Self-pay

## 2024-04-16 ENCOUNTER — Ambulatory Visit: Admitting: Family Medicine

## 2024-04-16 ENCOUNTER — Other Ambulatory Visit: Payer: Self-pay

## 2024-04-16 VITALS — BP 110/76 | HR 93 | Temp 97.3°F | Ht 64.57 in | Wt 258.0 lb

## 2024-04-16 DIAGNOSIS — E66813 Obesity, class 3: Secondary | ICD-10-CM

## 2024-04-16 DIAGNOSIS — E119 Type 2 diabetes mellitus without complications: Secondary | ICD-10-CM | POA: Diagnosis not present

## 2024-04-16 DIAGNOSIS — Z6841 Body Mass Index (BMI) 40.0 and over, adult: Secondary | ICD-10-CM

## 2024-04-16 DIAGNOSIS — Z7985 Long-term (current) use of injectable non-insulin antidiabetic drugs: Secondary | ICD-10-CM | POA: Diagnosis not present

## 2024-04-16 DIAGNOSIS — E6609 Other obesity due to excess calories: Secondary | ICD-10-CM | POA: Insufficient documentation

## 2024-04-16 MED ORDER — TIRZEPATIDE 5 MG/0.5ML ~~LOC~~ SOAJ
5.0000 mg | SUBCUTANEOUS | 11 refills | Status: DC
  Filled 2024-04-16 – 2024-05-07 (×4): qty 2, 28d supply, fill #0
  Filled 2024-06-09: qty 2, 28d supply, fill #1
  Filled 2024-07-09: qty 2, 28d supply, fill #2

## 2024-04-16 MED ORDER — BLOOD GLUCOSE TEST VI STRP
1.0000 | ORAL_STRIP | Freq: Three times a day (TID) | 0 refills | Status: AC
  Filled 2024-04-16: qty 100, 34d supply, fill #0

## 2024-04-16 MED ORDER — LANCET DEVICE MISC
1.0000 | Freq: Three times a day (TID) | 0 refills | Status: AC
  Filled 2024-04-16: qty 1, 30d supply, fill #0

## 2024-04-16 MED ORDER — LANCETS 33G MISC
1.0000 | Freq: Three times a day (TID) | 0 refills | Status: AC
  Filled 2024-04-16: qty 100, 34d supply, fill #0

## 2024-04-16 MED ORDER — BLOOD GLUCOSE MONITOR SYSTEM W/DEVICE KIT
1.0000 | PACK | Freq: Three times a day (TID) | 0 refills | Status: AC
Start: 2024-04-16 — End: ?
  Filled 2024-04-16: qty 1, 30d supply, fill #0

## 2024-04-16 NOTE — Assessment & Plan Note (Signed)
 Chronic, unable to check blood sugars at home.  Prescription for glucometer test strips and lancets sent to her pharmacy. She is tolerating Mounjaro  at 2.5 mg weekly but has not seen any weight change.  She has noted some decreased appetite. We will increase the Mounjaro  to 5 mg weekly.  She will follow-up for A1c in 2 months which will be 3 months from the last check.  She will contact me if she is interested in an increase of the medication dose between now and then if she has not noted any further weight loss.  Return and ER precautions provided.

## 2024-04-16 NOTE — Assessment & Plan Note (Signed)
 Chronic, Increase GLP-1 medication dose. She will continue working on low carbohydrate diet, small portion size. She will keep up with water intake. She will continue 3 days a week exercise and titrate up if able.

## 2024-04-16 NOTE — Progress Notes (Signed)
 Patient ID: VIOLANDA Bender, female    DOB: 1971-05-04, 53 y.o.   MRN: [id number]  This visit was conducted in person.  BP 110/76   Pulse 93   Temp (!) 97.3 F (36.3 C) (Temporal)   Ht 5' 4.57" (1.64 m)   Wt 258 lb (117 kg)   LMP 03/27/2014   SpO2 95%   BMI 43.51 kg/m    CC:  Chief Complaint  Patient presents with   Medication Management    Follow up Mounjaro  Start    Subjective:   HPI: Patricia Bender is a 53 y.o. female presenting on 04/16/2024 for Medication Management (Follow up Mounjaro  Start)   She started on Mounjaro  2.5 mg weekly for DM control and weight management.  No SE. She feel less hungry, she is eating less overall.  No weight change noted.   Not checking blood sugars , does not have glucometer.   Body mass index is 43.51 kg/m.  Wt Readings from Last 3 Encounters:  04/16/24 258 lb (117 kg)  03/04/24 258 lb (117 kg)  01/02/23 271 lb (122.9 kg)   Lab Results  Component Value Date   HGBA1C 7.2 (H) 02/26/2024    She has been going to gym at work 3 days a week. Doing elliptical and stair stepper,  toning exercsies.     Relevant past medical, surgical, family and social history reviewed and updated as indicated. Interim medical history since our last visit reviewed. Allergies and medications reviewed and updated. Outpatient Medications Prior to Visit  Medication Sig Dispense Refill   albuterol  (VENTOLIN  HFA) 108 (90 Base) MCG/ACT inhaler TAKE 2 PUFFS BY MOUTH EVERY 6 HOURS AS NEEDED FOR WHEEZE OR SHORTNESS OF BREATH 8.5 each 0   amphetamine -dextroamphetamine  (ADDERALL  XR) 30 MG 24 hr capsule Take 1 capsule (30 mg total) by mouth daily. 90 capsule 0   amphetamine -dextroamphetamine  (ADDERALL ) 10 MG tablet Take 1 tablet (10 mg total) by mouth daily at 12 noon as needed. 90 tablet 0   buPROPion  (WELLBUTRIN  XL) 300 MG 24 hr tablet Take 1 tablet (300 mg total) by mouth daily. 90 tablet 1   citalopram  (CELEXA ) 20 MG tablet Take 1 tablet (20 mg total)  by mouth at bedtime. 90 tablet 1   clonazePAM  (KLONOPIN ) 0.5 MG tablet Take 1 tablet (0.5 mg total) by mouth daily as needed for agitation only. 90 tablet 1   amphetamine -dextroamphetamine  (ADDERALL  XR) 30 MG 24 hr capsule Take 1 capsule (30 mg total) by mouth daily. 90 capsule 0   amphetamine -dextroamphetamine  (ADDERALL  XR) 30 MG 24 hr capsule Take 1 capsule (30 mg total) by mouth daily. 90 capsule 0   amphetamine -dextroamphetamine  (ADDERALL ) 10 MG tablet Take 1 tablet by mouth once daily at noon as needed 90 tablet 0   amphetamine -dextroamphetamine  (ADDERALL ) 10 MG tablet Take 1 tablet (10 mg total) by mouth daily at noon as needed 90 tablet 0   amphetamine -dextroamphetamine  (ADDERALL ) 10 MG tablet Take 1 tablet (10 mg total) by mouth at noon as needed. 90 tablet 0   amphetamine -dextroamphetamine  (ADDERALL ) 10 MG tablet Take 1 tablet (10 mg total) by mouth daily at noon as needed 90 tablet 0   buPROPion  (WELLBUTRIN  XL) 300 MG 24 hr tablet Take 1 tablet by mouth daily. 90 tablet 1   buPROPion  (WELLBUTRIN  XL) 300 MG 24 hr tablet Take 1 tablet (300 mg total) by mouth daily. 90 tablet 1   citalopram  (CELEXA ) 20 MG tablet Take 1 tablet by mouth at bedtime.  90 tablet 1   citalopram  (CELEXA ) 20 MG tablet Take 1 tablet by mouth at bedtime 90 tablet 1   clonazePAM  (KLONOPIN ) 0.5 MG tablet TAKE 1 TABLET BY MOUTH AS NEEDED FOR AGITATION ONLY (90 DAY SUPPLY) 90 tablet 1   clonazePAM  (KLONOPIN ) 0.5 MG tablet Take 1 tablet (0.5 mg total) by mouth daily as needed for agitation only. 90 tablet 0   tirzepatide  (MOUNJARO ) 2.5 MG/0.5ML Pen Inject 2.5 mg into the skin once a week. 2 mL 3   No facility-administered medications prior to visit.     Per HPI unless specifically indicated in ROS section below Review of Systems  Constitutional:  Negative for fatigue and fever.  HENT:  Negative for congestion.   Eyes:  Negative for pain.  Respiratory:  Negative for cough and shortness of breath.   Cardiovascular:   Negative for chest pain, palpitations and leg swelling.  Gastrointestinal:  Negative for abdominal pain.  Genitourinary:  Negative for dysuria and vaginal bleeding.  Musculoskeletal:  Negative for back pain.  Neurological:  Negative for syncope, light-headedness and headaches.  Psychiatric/Behavioral:  Negative for dysphoric mood.    Objective:  BP 110/76   Pulse 93   Temp (!) 97.3 F (36.3 C) (Temporal)   Ht 5' 4.57" (1.64 m)   Wt 258 lb (117 kg)   LMP 03/27/2014   SpO2 95%   BMI 43.51 kg/m   Wt Readings from Last 3 Encounters:  04/16/24 258 lb (117 kg)  03/04/24 258 lb (117 kg)  01/02/23 271 lb (122.9 kg)      Physical Exam Constitutional:      General: She is not in acute distress.    Appearance: Normal appearance. She is well-developed. She is obese. She is not ill-appearing or toxic-appearing.  HENT:     Head: Normocephalic.     Right Ear: Hearing, tympanic membrane, ear canal and external ear normal. Tympanic membrane is not erythematous, retracted or bulging.     Left Ear: Hearing, tympanic membrane, ear canal and external ear normal. Tympanic membrane is not erythematous, retracted or bulging.     Nose: No mucosal edema or rhinorrhea.     Right Sinus: No maxillary sinus tenderness or frontal sinus tenderness.     Left Sinus: No maxillary sinus tenderness or frontal sinus tenderness.     Mouth/Throat:     Pharynx: Uvula midline.  Eyes:     General: Lids are normal. Lids are everted, no foreign bodies appreciated.     Conjunctiva/sclera: Conjunctivae normal.     Pupils: Pupils are equal, round, and reactive to light.  Neck:     Thyroid: No thyroid mass or thyromegaly.     Vascular: No carotid bruit.     Trachea: Trachea normal.  Cardiovascular:     Rate and Rhythm: Normal rate and regular rhythm.     Pulses: Normal pulses.     Heart sounds: Normal heart sounds, S1 normal and S2 normal. No murmur heard.    No friction rub. No gallop.  Pulmonary:     Effort:  Pulmonary effort is normal. No tachypnea or respiratory distress.     Breath sounds: Normal breath sounds. No decreased breath sounds, wheezing, rhonchi or rales.  Abdominal:     General: Bowel sounds are normal.     Palpations: Abdomen is soft.     Tenderness: There is no abdominal tenderness.  Musculoskeletal:     Cervical back: Normal range of motion and neck supple.  Skin:  General: Skin is warm and dry.     Findings: No rash.  Neurological:     Mental Status: She is alert.  Psychiatric:        Mood and Affect: Mood is not anxious or depressed.        Speech: Speech normal.        Behavior: Behavior normal. Behavior is cooperative.        Thought Content: Thought content normal.        Judgment: Judgment normal.       Results for orders placed or performed in visit on 03/04/24  HM DIABETES EYE EXAM   Collection Time: 02/24/24 10:36 AM  Result Value Ref Range   HM Diabetic Eye Exam      Assessment and Plan  Type 2 diabetes mellitus without complication, without long-term current use of insulin (HCC) Assessment & Plan: Chronic, unable to check blood sugars at home.  Prescription for glucometer test strips and lancets sent to her pharmacy. She is tolerating Mounjaro  at 2.5 mg weekly but has not seen any weight change.  She has noted some decreased appetite. We will increase the Mounjaro  to 5 mg weekly.  She will follow-up for A1c in 2 months which will be 3 months from the last check.  She will contact me if she is interested in an increase of the medication dose between now and then if she has not noted any further weight loss.  Return and ER precautions provided.   Class 3 severe obesity due to excess calories with serious comorbidity and body mass index (BMI) of 40.0 to 44.9 in adult Assessment & Plan: Chronic, Increase GLP-1 medication dose. She will continue working on low carbohydrate diet, small portion size. She will keep up with water intake. She will  continue 3 days a week exercise and titrate up if able.   Other orders -     Blood Glucose Monitoring Suppl; 1 each by Does not apply route in the morning, at noon, and at bedtime. May substitute to any manufacturer covered by patient's insurance.  Dispense: 1 each; Refill: 0 -     Blood Glucose Test; 1 each by In Vitro route in the morning, at noon, and at bedtime. May substitute to any manufacturer covered by patient's insurance.  Dispense: 100 strip; Refill: 0 -     Lancet Device; 1 each by Does not apply route in the morning, at noon, and at bedtime. May substitute to any manufacturer covered by patient's insurance.  Dispense: 1 each; Refill: 0 -     Lancets Misc.; 1 each by Does not apply route in the morning, at noon, and at bedtime. May substitute to any manufacturer covered by patient's insurance.  Dispense: 100 each; Refill: 0 -     Tirzepatide ; Inject 5 mg into the skin once a week.  Dispense: 2 mL; Refill: 11    Return in about 2 months (around 06/16/2024) for diabetes follow up POC A1C.   Herby Lolling, MD

## 2024-04-17 ENCOUNTER — Other Ambulatory Visit (HOSPITAL_COMMUNITY): Payer: Self-pay

## 2024-04-21 ENCOUNTER — Encounter: Payer: Self-pay | Admitting: Family Medicine

## 2024-04-22 ENCOUNTER — Other Ambulatory Visit (HOSPITAL_COMMUNITY): Payer: Self-pay

## 2024-04-22 ENCOUNTER — Telehealth: Payer: Self-pay

## 2024-04-22 NOTE — Telephone Encounter (Signed)
 Pharmacy Patient Advocate Encounter   Received notification from Patient Advice Request messages that prior authorization for Mounjaro  5MG /0.5ML auto-injectors is required/requested.   Insurance verification completed.   The patient is insured through [facility] .   Per test claim: PA required; PA submitted to above mentioned insurance via CoverMyMeds Key/confirmation #/EOC [id number] Status is pending

## 2024-04-22 NOTE — Telephone Encounter (Signed)
 Pharmacy Patient Advocate Encounter  Received notification from [facility] that Prior Authorization for Mounjaro  5MG /0.5ML auto-injectors  has been APPROVED from 04/22/24 to 04/22/25. Unable to obtain price due to refill too soon rejection, last fill date 04/14/24 next available fill date6/12/25   PA #/Case ID/Reference #: [id number]

## 2024-04-22 NOTE — Telephone Encounter (Signed)
 Please submit PA for Mounjaro  5 mg

## 2024-04-23 ENCOUNTER — Encounter: Payer: Self-pay | Admitting: Internal Medicine

## 2024-04-27 ENCOUNTER — Other Ambulatory Visit (HOSPITAL_COMMUNITY): Payer: Self-pay

## 2024-05-07 ENCOUNTER — Other Ambulatory Visit (HOSPITAL_COMMUNITY): Payer: Self-pay

## 2024-05-07 ENCOUNTER — Other Ambulatory Visit: Payer: Self-pay

## 2024-05-11 DIAGNOSIS — L821 Other seborrheic keratosis: Secondary | ICD-10-CM | POA: Diagnosis not present

## 2024-05-11 DIAGNOSIS — L578 Other skin changes due to chronic exposure to nonionizing radiation: Secondary | ICD-10-CM | POA: Diagnosis not present

## 2024-05-28 ENCOUNTER — Telehealth: Payer: Self-pay

## 2024-05-28 ENCOUNTER — Other Ambulatory Visit (HOSPITAL_COMMUNITY): Payer: Self-pay

## 2024-05-28 ENCOUNTER — Encounter: Payer: Self-pay | Admitting: Internal Medicine

## 2024-05-28 ENCOUNTER — Ambulatory Visit

## 2024-05-28 VITALS — Ht 65.0 in | Wt 150.0 lb

## 2024-05-28 DIAGNOSIS — Z1211 Encounter for screening for malignant neoplasm of colon: Secondary | ICD-10-CM

## 2024-05-28 MED ORDER — NA SULFATE-K SULFATE-MG SULF 17.5-3.13-1.6 GM/177ML PO SOLN
1.0000 | Freq: Once | ORAL | 0 refills | Status: AC
  Filled 2024-05-28 – 2024-06-10 (×2): qty 354, 1d supply, fill #0

## 2024-05-28 NOTE — Progress Notes (Signed)

## 2024-05-28 NOTE — Telephone Encounter (Signed)
Completed PV

## 2024-05-28 NOTE — Telephone Encounter (Signed)
 No answer. Left message that we had a PV at 8am and would call back in 5 min

## 2024-06-08 ENCOUNTER — Other Ambulatory Visit (HOSPITAL_COMMUNITY): Payer: Self-pay

## 2024-06-10 ENCOUNTER — Other Ambulatory Visit (HOSPITAL_COMMUNITY): Payer: Self-pay

## 2024-06-12 ENCOUNTER — Ambulatory Visit: Admitting: Internal Medicine

## 2024-06-12 ENCOUNTER — Encounter: Payer: Self-pay | Admitting: Internal Medicine

## 2024-06-12 VITALS — BP 104/74 | HR 67 | Temp 97.3°F | Resp 12 | Ht 64.5 in | Wt 150.0 lb

## 2024-06-12 DIAGNOSIS — K5730 Diverticulosis of large intestine without perforation or abscess without bleeding: Secondary | ICD-10-CM

## 2024-06-12 DIAGNOSIS — F419 Anxiety disorder, unspecified: Secondary | ICD-10-CM | POA: Diagnosis not present

## 2024-06-12 DIAGNOSIS — D124 Benign neoplasm of descending colon: Secondary | ICD-10-CM | POA: Diagnosis not present

## 2024-06-12 DIAGNOSIS — D123 Benign neoplasm of transverse colon: Secondary | ICD-10-CM | POA: Diagnosis not present

## 2024-06-12 DIAGNOSIS — K635 Polyp of colon: Secondary | ICD-10-CM | POA: Diagnosis not present

## 2024-06-12 DIAGNOSIS — Z1211 Encounter for screening for malignant neoplasm of colon: Secondary | ICD-10-CM | POA: Diagnosis not present

## 2024-06-12 DIAGNOSIS — K648 Other hemorrhoids: Secondary | ICD-10-CM

## 2024-06-12 DIAGNOSIS — E119 Type 2 diabetes mellitus without complications: Secondary | ICD-10-CM | POA: Diagnosis not present

## 2024-06-12 DIAGNOSIS — F32A Depression, unspecified: Secondary | ICD-10-CM | POA: Diagnosis not present

## 2024-06-12 MED ORDER — SODIUM CHLORIDE 0.9 % IV SOLN: 500.0000 mL | INTRAVENOUS | Status: DC

## 2024-06-12 NOTE — Patient Instructions (Addendum)
 Await pathology results.  Continue your normal medications  Please read over handouts provided   YOU HAD AN ENDOSCOPIC PROCEDURE TODAY AT THE [facility]:   Refer to the procedure report that was given to you for any specific questions about what was found during the examination.  If the procedure report does not answer your questions, please call your gastroenterologist to clarify.  If you requested that your care partner not be given the details of your procedure findings, then the procedure report has been included in a sealed envelope for you to review at your convenience later.  YOU SHOULD EXPECT: Some feelings of bloating in the abdomen. Passage of more gas than usual.  Walking can help get rid of the air that was put into your GI tract during the procedure and reduce the bloating. If you had a lower endoscopy (such as a colonoscopy or flexible sigmoidoscopy) you may notice spotting of blood in your stool or on the toilet paper. If you underwent a bowel prep for your procedure, you may not have a normal bowel movement for a few days.  Please Note:  You might notice some irritation and congestion in your nose or some drainage.  This is from the oxygen used during your procedure.  There is no need for concern and it should clear up in a day or so.  SYMPTOMS TO REPORT IMMEDIATELY:  Following lower endoscopy (colonoscopy or flexible sigmoidoscopy):  Excessive amounts of blood in the stool  Significant tenderness or worsening of abdominal pains  Swelling of the abdomen that is new, acute  Fever of 100F or higher For urgent or emergent issues, a gastroenterologist can be reached at any hour by calling [phone]. Do not use MyChart messaging for urgent concerns.    DIET:  We do recommend a small meal at first, but then you may proceed to your regular diet.  Drink plenty of fluids but you should avoid alcoholic beverages for 24 hours.  ACTIVITY:  You should plan to take it  easy for the rest of today and you should NOT DRIVE or use heavy machinery until tomorrow (because of the sedation medicines used during the test).    FOLLOW UP: Our staff will call the number listed on your records the next business day following your procedure.  We will call around 7:15- 8:00 am to check on you and address any questions or concerns that you may have regarding the information given to you following your procedure. If we do not reach you, we will leave a message.     If any biopsies were taken you will be contacted by phone or by letter within the next 1-3 weeks.  Please call us at [phone] if you have not heard about the biopsies in 3 weeks.    SIGNATURES/CONFIDENTIALITY: You and/or your care partner have signed paperwork which will be entered into your electronic medical record.  These signatures attest to the fact that that the information above on your After Visit Summary has been reviewed and is understood.  Full responsibility of the confidentiality of this discharge information lies with you and/or your care-partner.

## 2024-06-12 NOTE — Progress Notes (Signed)
 Pt states no changes since previsit

## 2024-06-12 NOTE — Progress Notes (Signed)
 Pt sedate, gd SR's, VSS, report to RN

## 2024-06-12 NOTE — Progress Notes (Signed)
 Called to room to assist during endoscopic procedure.  Patient ID and intended procedure confirmed with present staff. Received instructions for my participation in the procedure from the performing physician.

## 2024-06-12 NOTE — Op Note (Signed)
 [facility] Patient Name: Patricia Bender Procedure Date: 06/12/2024 7:24 AM MRN: [id number] Endoscopist: Rosario Estefana Kidney , , [id number] Age: 53 Referring MD:  Date of Birth: Apr 16, 1971 Gender: Female Account #: [id number] Procedure:                Colonoscopy Indications:              Screening for colorectal malignant neoplasm, This                            is the patient's first colonoscopy Medicines:                Monitored Anesthesia Care Procedure:                Pre-Anesthesia Assessment:                           - Prior to the procedure, a History and Physical                            was performed, and patient medications and                            allergies were reviewed. The patient's tolerance of                            previous anesthesia was also reviewed. The risks                            and benefits of the procedure and the sedation                            options and risks were discussed with the patient.                            All questions were answered, and informed consent                            was obtained. Prior Anticoagulants: The patient has                            taken no anticoagulant or antiplatelet agents. ASA                            Grade Assessment: II - A patient with mild systemic                            disease. After reviewing the risks and benefits,                            the patient was deemed in satisfactory condition to                            undergo the procedure.  After obtaining informed consent, the colonoscope                            was passed under direct vision. Throughout the                            procedure, the patient's blood pressure, pulse, and                            oxygen  saturations were monitored continuously. The                            CF [id number] #[id number] was introduced through the anus                            and advanced to  the the terminal ileum. The                            colonoscopy was performed without difficulty. The                            patient tolerated the procedure well. The quality                            of the bowel preparation was excellent. The                            terminal ileum, ileocecal valve, appendiceal                            orifice, and rectum were photographed. Scope In: 8:17:27 AM Scope Out: 8:37:05 AM Scope Withdrawal Time: 0 hours 14 minutes 48 seconds  Total Procedure Duration: 0 hours 19 minutes 38 seconds  Findings:                 The terminal ileum appeared normal.                           Two sessile polyps were found in the descending                            colon and transverse colon. The polyps were 5 to 8                            mm in size. These polyps were removed with a cold                            snare. Resection and retrieval were complete.                           Multiple diverticula were found in the sigmoid                            colon and descending colon.  Non-bleeding internal hemorrhoids were found during                            retroflexion. Complications:            No immediate complications. Estimated Blood Loss:     Estimated blood loss was minimal. Impression:               - The examined portion of the ileum was normal.                           - Two 5 to 8 mm polyps in the descending colon and                            in the transverse colon, removed with a cold snare.                            Resected and retrieved.                           - Diverticulosis in the sigmoid colon and in the                            descending colon.                           - Non-bleeding internal hemorrhoids. Recommendation:           - Discharge patient to home (with escort).                           - Await pathology results.                           - The findings and recommendations  were discussed                            with the patient. Dr [name] Rosario [name],  06/12/2024 8:40:44 AM

## 2024-06-12 NOTE — Progress Notes (Signed)
 GASTROENTEROLOGY PROCEDURE H&P NOTE   Primary Care Physician: Avelina Greig BRAVO, MD    Reason for Procedure:   Colon cancer screening  Plan:    Colonoscopy  Patient is appropriate for endoscopic procedure(s) in the ambulatory ([facility]) setting.  The nature of the procedure, as well as the risks, benefits, and alternatives were carefully and thoroughly reviewed with the patient. Ample time for discussion and questions allowed. The patient understood, was satisfied, and agreed to proceed.     HPI: Patricia Bender is a 53 y.o. female who presents for colonoscopy for colon cancer screening. She last saw rectal bleeding a year ago. Denies changes in bowel habits or unintentional weight loss. Maternal grandfather had colon cancer. This is her first colonoscopy.  Past Medical History:  Diagnosis Date   ADHD (attention deficit hyperactivity disorder)    Anemia    History   Anxiety    Depression    Diabetes mellitus without complication (HCC)    Seasonal allergies    SVD (spontaneous vaginal delivery)    x 4    Past Surgical History:  Procedure Laterality Date   BILATERAL SALPINGECTOMY Bilateral 12/21/2014   Procedure: BILATERAL SALPINGECTOMY;  Surgeon: Lynwood BRAVO Curlene PONCE, MD;  Location: [facility];  Service: Gynecology;  Laterality: Bilateral;   CHOLECYSTECTOMY  03/2011   LAPAROSCOPIC ASSISTED VAGINAL HYSTERECTOMY N/A 12/21/2014   Procedure: LAPAROSCOPIC ASSISTED VAGINAL HYSTERECTOMY BILATERAL SALPINGECTOMY ;  Surgeon: Lynwood BRAVO Curlene PONCE, MD;  Location: [facility];  Service: Gynecology;  Laterality: N/A;   LAPAROSCOPY N/A 05/19/2014   Procedure: LAPAROSCOPY DIAGNOSTIC;  Surgeon: Lynwood BRAVO Curlene PONCE, MD;  Location: [facility];  Service: Gynecology;  Laterality: N/A;   TUBAL LIGATION     WISDOM TOOTH EXTRACTION      Prior to Admission medications   Medication Sig Start Date End Date Taking? Authorizing Provider  albuterol  (VENTOLIN  HFA) 108 (90 Base) MCG/ACT inhaler TAKE 2 PUFFS BY MOUTH EVERY 6  HOURS AS NEEDED FOR WHEEZE OR SHORTNESS OF BREATH 11/15/22   Bedsole, Amy E, MD  amphetamine -dextroamphetamine  (ADDERALL  XR) 30 MG 24 hr capsule Take 1 capsule (30 mg total) by mouth daily. 02/19/24     amphetamine -dextroamphetamine  (ADDERALL ) 10 MG tablet Take 1 tablet (10 mg total) by mouth daily at 12 noon as needed. 02/19/24     Blood Glucose Monitoring Suppl (BLOOD GLUCOSE MONITOR SYSTEM) w/Device KIT Use to test blood sugars in the morning, at noon, and at bedtime. 04/16/24   Bedsole, Amy E, MD  buPROPion  (WELLBUTRIN  XL) 300 MG 24 hr tablet Take 1 tablet (300 mg total) by mouth daily. 02/19/24     citalopram  (CELEXA ) 20 MG tablet Take 1 tablet (20 mg total) by mouth at bedtime. 02/19/24     clonazePAM  (KLONOPIN ) 0.5 MG tablet Take 1 tablet (0.5 mg total) by mouth daily as needed for agitation only. 02/19/24     tirzepatide  (MOUNJARO ) 5 MG/0.5ML Pen Inject 5 mg into the skin once a week. 04/16/24   Avelina Greig BRAVO, MD    Current Outpatient Medications  Medication Sig Dispense Refill   albuterol  (VENTOLIN  HFA) 108 (90 Base) MCG/ACT inhaler TAKE 2 PUFFS BY MOUTH EVERY 6 HOURS AS NEEDED FOR WHEEZE OR SHORTNESS OF BREATH 8.5 each 0   amphetamine -dextroamphetamine  (ADDERALL  XR) 30 MG 24 hr capsule Take 1 capsule (30 mg total) by mouth daily. 90 capsule 0   amphetamine -dextroamphetamine  (ADDERALL ) 10 MG tablet Take 1 tablet (10 mg total) by mouth daily at 12 noon as needed.  90 tablet 0   Blood Glucose Monitoring Suppl (BLOOD GLUCOSE MONITOR SYSTEM) w/Device KIT Use to test blood sugars in the morning, at noon, and at bedtime. 1 kit 0   buPROPion  (WELLBUTRIN  XL) 300 MG 24 hr tablet Take 1 tablet (300 mg total) by mouth daily. 90 tablet 1   citalopram  (CELEXA ) 20 MG tablet Take 1 tablet (20 mg total) by mouth at bedtime. 90 tablet 1   clonazePAM  (KLONOPIN ) 0.5 MG tablet Take 1 tablet (0.5 mg total) by mouth daily as needed for agitation only. 90 tablet 1   tirzepatide  (MOUNJARO ) 5 MG/0.5ML Pen Inject 5 mg  into the skin once a week. 2 mL 11   Current Facility-Administered Medications  Medication Dose Route Frequency Provider Last Rate Last Admin   0.9 %  sodium chloride  infusion  500 mL Intravenous Continuous Bender Rosario BROCKS, MD        Allergies as of 06/12/2024   (No Known Allergies)    Family History  Problem Relation Age of Onset   Colon polyps Mother    Colon cancer Maternal Grandfather    Esophageal cancer Neg Hx    Rectal cancer Neg Hx    Stomach cancer Neg Hx     Social History   Socioeconomic History   Marital status: Married    Spouse name: Not on file   Number of children: 4   Years of education: Not on file   Highest education level: Not on file  Occupational History   Occupation: [employer] lab  Tobacco Use   Smoking status: Every Day    Current packs/day: 0.00    Average packs/day: 0.5 packs/day for 21.0 years (10.5 ttl pk-yrs)    Types: Cigarettes    Start date: 05/27/1995    Last attempt to quit: 05/26/2016    Years since quitting: 8.0   Smokeless tobacco: Never  Vaping Use   Vaping status: Never Used  Substance and Sexual Activity   Alcohol use: No    Alcohol/week: 0.0 standard drinks of alcohol   Drug use: No   Sexual activity: Yes    Birth control/protection: Surgical  Other Topics Concern   Not on file  Social History Narrative   Works in Financial planner at [employer]   Social Drivers of Corporate investment banker Strain: Not on file  Food Insecurity: Not on file  Transportation Needs: Not on file  Physical Activity: Not on file  Stress: Not on file  Social Connections: Not on file  Intimate Partner Violence: Not on file    Physical Exam: Vital signs in last 24 hours: BP (!) 109/59   Pulse 67   Temp (!) 97.3 F (36.3 C)   Resp 11   Ht 5' 4.5 (1.638 m)   Wt 150 lb (68 kg)   LMP 03/27/2014   SpO2 98%   BMI 25.35 kg/m  GEN: NAD EYE: Sclerae anicteric ENT: MMM CV: Non-tachycardic Pulm: No increased work of breathing GI: Soft, NT/ND NEURO:   Alert & Oriented   Patricia Federico, MD [facility]  06/12/2024 8:14 AM

## 2024-06-15 ENCOUNTER — Telehealth: Payer: Self-pay

## 2024-06-15 NOTE — Telephone Encounter (Signed)
 Attempted f/u call. No answer, left VM.

## 2024-06-16 ENCOUNTER — Ambulatory Visit: Payer: Self-pay | Admitting: Internal Medicine

## 2024-06-16 LAB — SURGICAL PATHOLOGY

## 2024-07-09 ENCOUNTER — Other Ambulatory Visit (HOSPITAL_COMMUNITY): Payer: Self-pay

## 2024-07-09 MED ORDER — AMPHETAMINE-DEXTROAMPHET ER 30 MG PO CP24
30.0000 mg | ORAL_CAPSULE | Freq: Every morning | ORAL | 0 refills | Status: DC
  Filled 2024-07-09: qty 90, 90d supply, fill #0

## 2024-07-09 MED ORDER — AMPHETAMINE-DEXTROAMPHETAMINE 10 MG PO TABS
10.0000 mg | ORAL_TABLET | Freq: Every day | ORAL | 0 refills | Status: DC
  Filled 2024-07-09 (×2): qty 90, 90d supply, fill #0

## 2024-07-13 ENCOUNTER — Other Ambulatory Visit (HOSPITAL_COMMUNITY): Payer: Self-pay

## 2024-08-03 ENCOUNTER — Encounter: Payer: Self-pay | Admitting: Family Medicine

## 2024-08-03 NOTE — Telephone Encounter (Signed)
 I don't see a f/u for wt mgmt. But it looks like pt is past due for 2 mo DM f/u (should have been around 06/16/24).

## 2024-08-05 ENCOUNTER — Other Ambulatory Visit (HOSPITAL_COMMUNITY): Payer: Self-pay

## 2024-08-07 ENCOUNTER — Ambulatory Visit: Admitting: Family Medicine

## 2024-08-07 ENCOUNTER — Encounter: Payer: Self-pay | Admitting: Family Medicine

## 2024-08-07 ENCOUNTER — Other Ambulatory Visit (HOSPITAL_COMMUNITY): Payer: Self-pay

## 2024-08-07 VITALS — BP 116/82 | HR 91 | Temp 97.9°F | Ht 64.57 in | Wt 231.4 lb

## 2024-08-07 DIAGNOSIS — Z23 Encounter for immunization: Secondary | ICD-10-CM | POA: Diagnosis not present

## 2024-08-07 DIAGNOSIS — E66812 Obesity, class 2: Secondary | ICD-10-CM | POA: Diagnosis not present

## 2024-08-07 DIAGNOSIS — E119 Type 2 diabetes mellitus without complications: Secondary | ICD-10-CM | POA: Diagnosis not present

## 2024-08-07 DIAGNOSIS — Z7985 Long-term (current) use of injectable non-insulin antidiabetic drugs: Secondary | ICD-10-CM

## 2024-08-07 DIAGNOSIS — Z6839 Body mass index (BMI) 39.0-39.9, adult: Secondary | ICD-10-CM | POA: Diagnosis not present

## 2024-08-07 LAB — POCT GLYCOSYLATED HEMOGLOBIN (HGB A1C): Hemoglobin A1C: 5.4 % (ref 4.0–5.6)

## 2024-08-07 MED ORDER — TIRZEPATIDE 7.5 MG/0.5ML ~~LOC~~ SOAJ
7.5000 mg | SUBCUTANEOUS | 1 refills | Status: AC
  Filled 2024-08-07: qty 2, 28d supply, fill #0
  Filled 2024-09-08: qty 2, 28d supply, fill #1
  Filled 2024-09-30 – 2024-10-06 (×2): qty 2, 28d supply, fill #2
  Filled 2024-11-04: qty 2, 28d supply, fill #3
  Filled 2024-12-03: qty 2, 28d supply, fill #4

## 2024-08-07 NOTE — Patient Instructions (Signed)
 Please call the location of your choice from the menu below to schedule your Mammogram and/or Bone Density appointment.    [facility]   [facility]                      Phone:  [phone] [address]                               [place], [place] [address]                                                             Services: Traditional and 3D Mammogram, Bone Density   [facility]                 Phone: [phone] [address]                                                       [place], [place] [address]    Service: Bone Density ONLY   *this site does NOT perform mammograms  [facility]                        Phone:  [phone] [address]                                  [place], [place] [address]                                            Services:  3D Mammogram and Bone Density    [facility]   Phone:  [phone]   [address]                                                                            [place], [place] [address]                                            Services: 3D Mammogram and Bone [facility])  Phone:  [phone]   [address]                        [place], [place] [address]  Services:  3D Mammogram and Bone Density

## 2024-08-07 NOTE — Assessment & Plan Note (Signed)
 Chronic, significant weight loss with Mounjaro . She has noted plateauing of weight loss in the last month.  Will increase Mounjaro  to 7.5 mg weekly.

## 2024-08-07 NOTE — Progress Notes (Signed)
 Patient ID: Patricia Bender, female    DOB: 1971/03/24, 53 y.o.   MRN: [id number]  This visit was conducted in person.  BP 116/82   Pulse 91   Temp 97.9 F (36.6 C) (Temporal)   Ht 5' 4.57 (1.64 m)   Wt 231 lb 6 oz (105 kg)   LMP 03/27/2014   SpO2 94%   BMI 39.02 kg/m    CC:  Chief Complaint  Patient presents with   Medical Management of Chronic Issues    Here for DM f/u.    Subjective:   HPI: Patricia Bender is a 53 y.o. female presenting on 08/07/2024 for Medical Management of Chronic Issues (Here for DM f/u.)   Diabetes:  Mounjaro  5 mg weekly for DM control and weight management.  No SE. She feel less hungry, she is eating less overall.  No abdominal pain, N/V. Lab Results  Component Value Date   HGBA1C 5.4 08/07/2024  Using medications without difficulties: Hypoglycemic episodes: Hyperglycemic episodes: Feet problems: Blood Sugars averaging: FBS eye exam uptodate  Body mass index is 39.02 kg/m.  Previous reading incorrect.. she has lost 30 lbs in last 5 months. Weight loss has plateau out in last month. Wt Readings from Last 3 Encounters:  08/07/24 231 lb 6 oz (105 kg)  06/12/24 150 lb (68 kg)  05/28/24 150 lb (68 kg)   Lab Results  Component Value Date   HGBA1C 5.4 08/07/2024    She has been going to gym at work 3 days a week. Doing elliptical and stair stepper,  toning exercsies.     Relevant past medical, surgical, family and social history reviewed and updated as indicated. Interim medical history since our last visit reviewed. Allergies and medications reviewed and updated. Outpatient Medications Prior to Visit  Medication Sig Dispense Refill   albuterol  (VENTOLIN  HFA) 108 (90 Base) MCG/ACT inhaler TAKE 2 PUFFS BY MOUTH EVERY 6 HOURS AS NEEDED FOR WHEEZE OR SHORTNESS OF BREATH 8.5 each 0   amphetamine -dextroamphetamine  (ADDERALL  XR) 30 MG 24 hr capsule Take 1 capsule (30 mg total) by mouth daily. 90 capsule 0    amphetamine -dextroamphetamine  (ADDERALL  XR) 30 MG 24 hr capsule Take 1 capsule (30 mg total) by mouth every morning. 90 capsule 0   amphetamine -dextroamphetamine  (ADDERALL ) 10 MG tablet Take 1 tablet (10 mg total) by mouth daily at 12 noon as needed. 90 tablet 0   amphetamine -dextroamphetamine  (ADDERALL ) 10 MG tablet Take 1 tablet (10 mg) by mouth daily at noon as needed for breakthrough symptoms. 90 tablet 0   Blood Glucose Monitoring Suppl (BLOOD GLUCOSE MONITOR SYSTEM) w/Device KIT Use to test blood sugars in the morning, at noon, and at bedtime. 1 kit 0   buPROPion  (WELLBUTRIN  XL) 300 MG 24 hr tablet Take 1 tablet (300 mg total) by mouth daily. 90 tablet 1   citalopram  (CELEXA ) 20 MG tablet Take 1 tablet (20 mg total) by mouth at bedtime. 90 tablet 1   clonazePAM  (KLONOPIN ) 0.5 MG tablet Take 1 tablet (0.5 mg total) by mouth daily as needed for agitation only. 90 tablet 1   tirzepatide  (MOUNJARO ) 5 MG/0.5ML Pen Inject 5 mg into the skin once a week. 2 mL 11   No facility-administered medications prior to visit.     Per HPI unless specifically indicated in ROS section below Review of Systems  Constitutional:  Negative for fatigue and fever.  HENT:  Negative for congestion.   Eyes:  Negative for pain.  Respiratory:  Negative  for cough and shortness of breath.   Cardiovascular:  Negative for chest pain, palpitations and leg swelling.  Gastrointestinal:  Negative for abdominal pain.  Genitourinary:  Negative for dysuria and vaginal bleeding.  Musculoskeletal:  Negative for back pain.  Neurological:  Negative for syncope, light-headedness and headaches.  Psychiatric/Behavioral:  Negative for dysphoric mood.    Objective:  BP 116/82   Pulse 91   Temp 97.9 F (36.6 C) (Temporal)   Ht 5' 4.57 (1.64 m)   Wt 231 lb 6 oz (105 kg)   LMP 03/27/2014   SpO2 94%   BMI 39.02 kg/m   Wt Readings from Last 3 Encounters:  08/07/24 231 lb 6 oz (105 kg)  06/12/24 150 lb (68 kg)  05/28/24 150 lb  (68 kg)      Physical Exam Constitutional:      General: She is not in acute distress.    Appearance: Normal appearance. She is well-developed. She is obese. She is not ill-appearing or toxic-appearing.  HENT:     Head: Normocephalic.     Right Ear: Hearing, tympanic membrane, ear canal and external ear normal. Tympanic membrane is not erythematous, retracted or bulging.     Left Ear: Hearing, tympanic membrane, ear canal and external ear normal. Tympanic membrane is not erythematous, retracted or bulging.     Nose: No mucosal edema or rhinorrhea.     Right Sinus: No maxillary sinus tenderness or frontal sinus tenderness.     Left Sinus: No maxillary sinus tenderness or frontal sinus tenderness.     Mouth/Throat:     Pharynx: Uvula midline.  Eyes:     General: Lids are normal. Lids are everted, no foreign bodies appreciated.     Conjunctiva/sclera: Conjunctivae normal.     Pupils: Pupils are equal, round, and reactive to light.  Neck:     Thyroid: No thyroid mass or thyromegaly.     Vascular: No carotid bruit.     Trachea: Trachea normal.  Cardiovascular:     Rate and Rhythm: Normal rate and regular rhythm.     Pulses: Normal pulses.     Heart sounds: Normal heart sounds, S1 normal and S2 normal. No murmur heard.    No friction rub. No gallop.  Pulmonary:     Effort: Pulmonary effort is normal. No tachypnea or respiratory distress.     Breath sounds: Normal breath sounds. No decreased breath sounds, wheezing, rhonchi or rales.  Abdominal:     General: Bowel sounds are normal.     Palpations: Abdomen is soft.     Tenderness: There is no abdominal tenderness.  Musculoskeletal:     Cervical back: Normal range of motion and neck supple.  Skin:    General: Skin is warm and dry.     Findings: No rash.  Neurological:     Mental Status: She is alert.  Psychiatric:        Mood and Affect: Mood is not anxious or depressed.        Speech: Speech normal.        Behavior: Behavior  normal. Behavior is cooperative.        Thought Content: Thought content normal.        Judgment: Judgment normal.       Results for orders placed or performed in visit on 08/07/24  POCT glycosylated hemoglobin (Hb A1C)   Collection Time: 08/07/24  9:34 AM  Result Value Ref Range   Hemoglobin A1C 5.4 4.0 - 5.6 %  HbA1c POC (<> result, manual entry)     HbA1c, POC (prediabetic range)     HbA1c, POC (controlled diabetic range)      Assessment and Plan  Type 2 diabetes mellitus without complication, without long-term current use of insulin (HCC) Assessment & Plan: Chronic, dramatic improvement of A1c down to normal range. Encouraged to continue work on diabetic diet and regular exercise.  Change Mounjaro  from 5.5 to 7.5 mg weekly.  Follow-up in 6 months for reevaluation  Orders: -     POCT glycosylated hemoglobin (Hb A1C)  Class 2 severe obesity due to excess calories with serious comorbidity and body mass index (BMI) of 39.0 to 39.9 in adult [facility]) Assessment & Plan: Chronic, significant weight loss with Mounjaro . She has noted plateauing of weight loss in the last month.  Will increase Mounjaro  to 7.5 mg weekly.   Other orders -     Tirzepatide ; Inject 7.5 mg into the skin once a week.  Dispense: 6 mL; Refill: 1    Given Prevnar 20 given age and DM.  Return in about 6 months (around 02/04/2025) for annual physical with fasting labs prior.   Greig Ring, MD

## 2024-08-07 NOTE — Assessment & Plan Note (Signed)
 Chronic, dramatic improvement of A1c down to normal range. Encouraged to continue work on diabetic diet and regular exercise.  Change Mounjaro  from 5.5 to 7.5 mg weekly.  Follow-up in 6 months for reevaluation

## 2024-08-07 NOTE — Addendum Note (Signed)
 Addended by: Waverley Krempasky on: 08/07/2024 10:04 AM   Modules accepted: Orders

## 2024-08-18 ENCOUNTER — Other Ambulatory Visit (HOSPITAL_COMMUNITY): Payer: Self-pay

## 2024-08-18 ENCOUNTER — Other Ambulatory Visit: Payer: Self-pay

## 2024-08-18 DIAGNOSIS — F3341 Major depressive disorder, recurrent, in partial remission: Secondary | ICD-10-CM | POA: Diagnosis not present

## 2024-08-18 MED ORDER — BUPROPION HCL ER (XL) 300 MG PO TB24
300.0000 mg | ORAL_TABLET | Freq: Every day | ORAL | 1 refills | Status: AC
  Filled 2024-08-18: qty 90, 90d supply, fill #0

## 2024-08-18 MED ORDER — CITALOPRAM HYDROBROMIDE 20 MG PO TABS
20.0000 mg | ORAL_TABLET | Freq: Every day | ORAL | 1 refills | Status: AC
  Filled 2024-08-18: qty 90, 90d supply, fill #0

## 2024-08-18 MED ORDER — CLONAZEPAM 0.5 MG PO TABS
0.5000 mg | ORAL_TABLET | Freq: Every day | ORAL | 1 refills | Status: AC | PRN
  Filled 2024-08-18: qty 90, 90d supply, fill #0

## 2024-08-18 MED ORDER — AMPHETAMINE-DEXTROAMPHET ER 30 MG PO CP24
30.0000 mg | ORAL_CAPSULE | Freq: Every morning | ORAL | 0 refills | Status: AC
  Filled 2024-10-06: qty 90, 90d supply, fill #0

## 2024-08-18 MED ORDER — AMPHETAMINE-DEXTROAMPHETAMINE 10 MG PO TABS
10.0000 mg | ORAL_TABLET | Freq: Every day | ORAL | 0 refills | Status: AC
  Filled 2024-10-06: qty 90, 90d supply, fill #0

## 2024-08-28 ENCOUNTER — Other Ambulatory Visit (HOSPITAL_COMMUNITY): Payer: Self-pay

## 2024-09-02 ENCOUNTER — Ambulatory Visit: Payer: Self-pay

## 2024-09-02 NOTE — Telephone Encounter (Signed)
 Noted

## 2024-09-02 NOTE — Telephone Encounter (Signed)
 Called patient she is able to have conversation. It does sound to me like she is winded but she states she is fine to talk. Advised to go to urgent care. She became upset and asked why she couldn't get appointment with our office. Advised that we didn't have one in time frame that she needed to be seen in. She states urgent care is just as bad as ED you have to wait for hours. I was attempting to look and see if any other office had opening and patient states  I guess I will just die then and disconnected call. I called patient to try and schedule with  but was sent to voicemail. I have left message asking to call back and we can get set up at another office today.

## 2024-09-02 NOTE — Telephone Encounter (Signed)
 FYI Only or Action Required?: Action required by provider: update on patient condition.  Patient was last seen in primary care on 08/07/2024 by Avelina Greig BRAVO, MD.  Called Nurse Triage reporting Shortness of Breath.  Symptoms began several days ago.  Interventions attempted: Nothing.  Symptoms are: rapidly worsening.  Triage Disposition: Go to ED Now (Notify PCP)  Patient/caregiver understands and will follow disposition?: No, refuses disposition-Patient refused ED. This RN spoke with CAL to make aware. Sending HP message to provider for follow-up  Copied from CRM #[id number]. Topic: Clinical - Red Word Triage >> Sep 02, 2024  8:29 AM Ivette P wrote: Red Word that prompted transfer to Nurse Triage: Pt having a hard time breathing, having upper respiratory issues. Possible infection or bronchitis. Reason for Disposition  [1] MODERATE difficulty breathing (e.g., speaks in phrases, SOB even at rest, pulse 100-120) AND [2] NEW-onset or WORSE than normal  Answer Assessment - Initial Assessment Questions 1. RESPIRATORY STATUS: Describe your breathing? (e.g., wheezing, shortness of breath, unable to speak, severe coughing)      Hard to take a breath, coughing, and wheezing  2. ONSET: When did this breathing problem begin?      Started 4 days ago  3. PATTERN Does the difficult breathing come and go, or has it been constant since it started?      Constant  4. SEVERITY: How bad is your breathing? (e.g., mild, moderate, severe)      Moderate shortness of breath, shortness of breath when sitting at rest and trouble speaking in full sentences  5. RECURRENT SYMPTOM: Have you had difficulty breathing before? If Yes, ask: When was the last time? and What happened that time?      Yes, when the weather changes patient says she gets bronchitis  6. CARDIAC HISTORY: Do you have any history of heart disease? (e.g., heart attack, angina, bypass surgery, angioplasty)      No  7. LUNG  HISTORY: Do you have any history of lung disease?  (e.g., pulmonary embolus, asthma, emphysema)     No  8. CAUSE: What do you think is causing the breathing problem?      Unsure of cause, thinks its related to season change  9. OTHER SYMPTOMS: Do you have any other symptoms? (e.g., chest pain, cough, dizziness, fever, runny nose)     Cough, headache, back pain  10. O2 SATURATION MONITOR:  Do you use an oxygen  saturation monitor (pulse oximeter) at home? If Yes, ask: What is your reading (oxygen  level) today? What is your usual oxygen  saturation reading? (e.g., 95%)       No  11. PREGNANCY: Is there any chance you are pregnant? When was your last menstrual period?       No  12. TRAVEL: Have you traveled out of the country in the last month? (e.g., travel history, exposures)       no  Protocols used: Breathing Difficulty-A-AH

## 2024-09-03 ENCOUNTER — Ambulatory Visit (INDEPENDENT_AMBULATORY_CARE_PROVIDER_SITE_OTHER)

## 2024-09-03 ENCOUNTER — Ambulatory Visit
Admission: RE | Admit: 2024-09-03 | Discharge: 2024-09-03 | Disposition: A | Discharge status: Home / Self Care | Payer: Self-pay | Attending: Emergency Medicine | Admitting: Emergency Medicine

## 2024-09-03 VITALS — BP 117/80 | HR 89 | Temp 97.7°F | Resp 18

## 2024-09-03 DIAGNOSIS — R0602 Shortness of breath: Secondary | ICD-10-CM

## 2024-09-03 DIAGNOSIS — J4521 Mild intermittent asthma with (acute) exacerbation: Secondary | ICD-10-CM

## 2024-09-03 DIAGNOSIS — R058 Other specified cough: Secondary | ICD-10-CM

## 2024-09-03 DIAGNOSIS — F17200 Nicotine dependence, unspecified, uncomplicated: Secondary | ICD-10-CM | POA: Diagnosis not present

## 2024-09-03 LAB — POC COVID19/FLU A&B COMBO
Covid Antigen, POC: NEGATIVE
Influenza A Antigen, POC: NEGATIVE
Influenza B Antigen, POC: NEGATIVE

## 2024-09-03 MED ORDER — ALBUTEROL SULFATE (2.5 MG/3ML) 0.083% IN NEBU
2.5000 mg | INHALATION_SOLUTION | Freq: Once | RESPIRATORY_TRACT | Status: AC
  Administered 2024-09-03: 2.5 mg via RESPIRATORY_TRACT

## 2024-09-03 MED ORDER — DOXYCYCLINE HYCLATE 100 MG PO CAPS
100.0000 mg | ORAL_CAPSULE | Freq: Two times a day (BID) | ORAL | 0 refills | Status: AC
Start: 2024-09-03 — End: 2024-09-10

## 2024-09-03 MED ORDER — PREDNISONE 10 MG PO TABS: 40.0000 mg | ORAL_TABLET | Freq: Every day | ORAL | 0 refills | Status: AC

## 2024-09-03 MED ORDER — ALBUTEROL SULFATE HFA 108 (90 BASE) MCG/ACT IN AERS: 1.0000 | INHALATION_SPRAY | Freq: Four times a day (QID) | RESPIRATORY_TRACT | 0 refills | Status: AC | PRN

## 2024-09-03 NOTE — Discharge Instructions (Addendum)
 Follow up with your primary care provider tomorrow.  Go to the emergency department if you have worsening symptoms.    Use the albuterol  inhaler as directed.  Take the doxycycline  and prednisone  as directed.

## 2024-09-03 NOTE — ED Triage Notes (Addendum)
 Patient to Urgent Care with complaints of nasal congestion/ shortness of breath/ wheezing/ upper back pain. Productive cough (white/ green).   Symptoms x4 days. Fevers for the first 1-2 days (max 103).   Taking claritin/ tylenol  cold and flu/ Nyquil.

## 2024-09-03 NOTE — ED Provider Notes (Signed)
 [facility]    CSN: [id number] Arrival date & time: 09/03/24  9165      History   Chief Complaint Chief Complaint  Patient presents with   Wheezing    Short of breath headache congestion dizzy - Entered by patient    HPI TRINNITY BREUNIG is a 53 y.o. female.  Patient presents with 2-day history of congestion, cough, wheezing, shortness of breath.  Her cough is productive.  She reports fever of 103 four days ago which resolved with Tylenol  and no fever since.  No chest pain, vomiting, diarrhea.  Her medical history includes reactive airway dysfunction syndrome.  She does not currently have an albuterol  inhaler.  Current everyday smoker.  The history is provided by the patient and medical records.    Past Medical History:  Diagnosis Date   ADHD (attention deficit hyperactivity disorder)    Anemia    History   Anxiety    Depression    Diabetes mellitus without complication (HCC)    Seasonal allergies    SVD (spontaneous vaginal delivery)    x 4    Patient Active Problem List   Diagnosis Date Noted   Class 2 obesity due to excess calories with body mass index (BMI) of 39.0 to 39.9 in adult 04/16/2024   Type 2 diabetes mellitus without complication, without long-term current use of insulin (HCC) 01/02/2023   Chest pain 01/02/2023   Menorrhagia 12/21/2014   Pedal edema 10/13/2014   Reactive airways dysfunction syndrome (HCC) 04/13/2013   Allergic rhinitis 04/13/2013   ANXIETY STATE, UNSPECIFIED 01/04/2010   Depression, major, recurrent, moderate (HCC) 09/08/2009   TOBACCO ABUSE 07/04/2007    Past Surgical History:  Procedure Laterality Date   BILATERAL SALPINGECTOMY Bilateral 12/21/2014   Procedure: BILATERAL SALPINGECTOMY;  Surgeon: Lynwood FORBES Curlene PONCE, MD;  Location: [facility];  Service: Gynecology;  Laterality: Bilateral;   CHOLECYSTECTOMY  03/2011   LAPAROSCOPIC ASSISTED VAGINAL HYSTERECTOMY N/A 12/21/2014   Procedure: LAPAROSCOPIC ASSISTED VAGINAL  HYSTERECTOMY BILATERAL SALPINGECTOMY ;  Surgeon: Lynwood FORBES Curlene PONCE, MD;  Location: [facility];  Service: Gynecology;  Laterality: N/A;   LAPAROSCOPY N/A 05/19/2014   Procedure: LAPAROSCOPY DIAGNOSTIC;  Surgeon: Lynwood FORBES Curlene PONCE, MD;  Location: [facility];  Service: Gynecology;  Laterality: N/A;   TUBAL LIGATION     WISDOM TOOTH EXTRACTION      OB History   No obstetric history on file.      Home Medications    Prior to Admission medications   Medication Sig Start Date End Date Taking? Authorizing Provider  albuterol  (VENTOLIN  HFA) 108 (90 Base) MCG/ACT inhaler Inhale 1-2 puffs into the lungs every 6 (six) hours as needed. 09/03/24  Yes Corlis Burnard DEL, NP  doxycycline  (VIBRAMYCIN ) 100 MG capsule Take 1 capsule (100 mg total) by mouth 2 (two) times daily for 7 days. 09/03/24 09/10/24 Yes Corlis Burnard DEL, NP  predniSONE  (DELTASONE ) 10 MG tablet Take 4 tablets (40 mg total) by mouth daily for 5 days. 09/03/24 09/08/24 Yes Corlis Burnard DEL, NP  amphetamine -dextroamphetamine  (ADDERALL  XR) 30 MG 24 hr capsule Take 1 capsule (30 mg total) by mouth daily. 02/19/24     amphetamine -dextroamphetamine  (ADDERALL  XR) 30 MG 24 hr capsule Take 1 capsule (30 mg total) by mouth every morning. 08/18/24     amphetamine -dextroamphetamine  (ADDERALL ) 10 MG tablet Take 1 tablet (10 mg total) by mouth daily at 12 noon as needed. 02/19/24     amphetamine -dextroamphetamine  (ADDERALL ) 10 MG tablet Take 1 tablet by  mouth daily at noon as needed for breakthrough symptoms. 08/18/24     Blood Glucose Monitoring Suppl (BLOOD GLUCOSE MONITOR SYSTEM) w/Device KIT Use to test blood sugars in the morning, at noon, and at bedtime. 04/16/24   Bedsole, Amy E, MD  buPROPion  (WELLBUTRIN  XL) 300 MG 24 hr tablet Take 1 tablet (300 mg total) by mouth daily. 02/19/24     buPROPion  (WELLBUTRIN  XL) 300 MG 24 hr tablet Take 1 tablet (300 mg total) by mouth daily. 08/18/24     citalopram  (CELEXA ) 20 MG tablet Take 1 tablet (20 mg total) by mouth at bedtime.  02/19/24     citalopram  (CELEXA ) 20 MG tablet Take 1 tablet (20 mg total) by mouth at bedtime. 08/18/24     clonazePAM  (KLONOPIN ) 0.5 MG tablet Take 1 tablet (0.5 mg total) by mouth daily as needed for agitation only. 02/19/24     clonazePAM  (KLONOPIN ) 0.5 MG tablet Take 1 tablet (0.5 mg total) by mouth daily only as needed for agitation. 08/18/24     tirzepatide  (MOUNJARO ) 7.5 MG/0.5ML Pen Inject 7.5 mg into the skin once a week. 08/07/24   Avelina Greig BRAVO, MD    Family History Family History  Problem Relation Age of Onset   Colon polyps Mother    Colon cancer Maternal Grandfather    Esophageal cancer Neg Hx    Rectal cancer Neg Hx    Stomach cancer Neg Hx     Social History Social History   Tobacco Use   Smoking status: Every Day    Current packs/day: 0.00    Average packs/day: 0.5 packs/day for 21.0 years (10.5 ttl pk-yrs)    Types: Cigarettes    Start date: 05/27/1995    Last attempt to quit: 05/26/2016    Years since quitting: 8.2   Smokeless tobacco: Never  Vaping Use   Vaping status: Never Used  Substance Use Topics   Alcohol use: No    Alcohol/week: 0.0 standard drinks of alcohol   Drug use: No     Allergies   Patient has no known allergies.   Review of Systems Review of Systems  Constitutional:  Positive for fever. Negative for chills.  HENT:  Positive for congestion. Negative for ear pain and sore throat.   Respiratory:  Positive for cough, shortness of breath and wheezing.   Cardiovascular:  Negative for chest pain and palpitations.     Physical Exam Triage Vital Signs ED Triage Vitals  Encounter Vitals Group     BP      Girls Systolic BP Percentile      Girls Diastolic BP Percentile      Boys Systolic BP Percentile      Boys Diastolic BP Percentile      Pulse      Resp      Temp      Temp src      SpO2      Weight      Height      Head Circumference      Peak Flow      Pain Score      Pain Loc      Pain Education      Exclude from Growth Chart     No data found.  Updated Vital Signs BP 117/80   Pulse 89   Temp 97.7 F (36.5 C)   Resp 18   LMP 03/27/2014   SpO2 95% Comment: Simultaneous filing. User may not have seen previous data.  Visual Acuity Right Eye Distance:   Left Eye Distance:   Bilateral Distance:    Right Eye Near:   Left Eye Near:    Bilateral Near:     Physical Exam Constitutional:      General: She is not in acute distress. HENT:     Right Ear: Tympanic membrane normal.     Left Ear: Tympanic membrane normal.     Nose: Nose normal.     Mouth/Throat:     Mouth: Mucous membranes are moist.     Pharynx: Oropharynx is clear.  Cardiovascular:     Rate and Rhythm: Normal rate and regular rhythm.     Heart sounds: Normal heart sounds.  Pulmonary:     Effort: Pulmonary effort is normal. No respiratory distress.     Breath sounds: Decreased air movement present. Wheezing present.     Comments: After albuterol  nebulizer treatment, improved air movement and decreased wheezing.  Patient reports decreased shortness of breath.  O2 sat 95% on room air. Neurological:     Mental Status: She is alert.      UC Treatments / Results  Labs (all labs ordered are listed, but only abnormal results are displayed) Labs Reviewed  POC COVID19/FLU A&B COMBO - Normal    EKG   Radiology DG Chest 2 View Result Date: 09/03/2024 CLINICAL DATA:  Productive cough, shortness of breath EXAM: CHEST - 2 VIEW COMPARISON:  05/19/2014 FINDINGS: The heart size and mediastinal contours are within normal limits. Diffuse bilateral interstitial opacity. The visualized skeletal structures are unremarkable. IMPRESSION: Diffuse bilateral interstitial opacity, consistent with edema or atypical/viral infection. No focal airspace opacity. Electronically Signed   By: Marolyn JONETTA Jaksch M.D.   On: 09/03/2024 09:49    Procedures Procedures (including critical care time)  Medications Ordered in UC Medications  albuterol  (PROVENTIL ) (2.5  MG/3ML) 0.083% nebulizer solution 2.5 mg (2.5 mg Nebulization Given 09/03/24 0857)    Initial Impression / Assessment and Plan / UC Course  I have reviewed the triage vital signs and the nursing notes.  Pertinent labs & imaging results that were available during my care of the patient were reviewed by me and considered in my medical decision making (see chart for details).    Shortness of breath, productive cough, current everyday smoker, exacerbation of reactive airway disease.  O2 sat 94% on room air on arrival.  Albuterol  nebulizer treatment given here.  After nebulizer treatment, improved air movement, decreased wheezing, and O2 sat 95% on room air.  CXR shows Diffuse bilateral interstitial opacity, consistent with edema or atypical/viral infection. No focal airspace opacity.  Treating today with albuterol  inhaler, prednisone , doxycycline .  Instructed patient to follow-up with her PCP tomorrow.  ED precautions given.  Education provided on shortness of breath, cough, health risk of smoking, asthma.  Patient agrees to plan of care.  Final Clinical Impressions(s) / UC Diagnoses   Final diagnoses:  Shortness of breath  Productive cough  Current every day smoker  RAD (reactive airway disease) with wheezing, mild intermittent, with acute exacerbation     Discharge Instructions      Follow up with your primary care provider tomorrow.  Go to the emergency department if you have worsening symptoms.    Use the albuterol  inhaler as directed.  Take the doxycycline  and prednisone  as directed.         ED Prescriptions     Medication Sig Dispense Auth. Provider   albuterol  (VENTOLIN  HFA) 108 (90 Base) MCG/ACT inhaler Inhale 1-2  puffs into the lungs every 6 (six) hours as needed. 18 g Corlis Burnard DEL, NP   predniSONE  (DELTASONE ) 10 MG tablet Take 4 tablets (40 mg total) by mouth daily for 5 days. 20 tablet Corlis Burnard DEL, NP   doxycycline  (VIBRAMYCIN ) 100 MG capsule Take 1 capsule (100 mg  total) by mouth 2 (two) times daily for 7 days. 14 capsule Corlis Burnard DEL, NP      PDMP not reviewed this encounter.   Corlis Burnard DEL, NP 09/03/24 1006

## 2024-09-04 ENCOUNTER — Ambulatory Visit (HOSPITAL_COMMUNITY): Payer: Self-pay

## 2024-09-17 ENCOUNTER — Other Ambulatory Visit (HOSPITAL_COMMUNITY): Payer: Self-pay

## 2024-09-17 MED ORDER — FREESTYLE LITE W/DEVICE KIT
PACK | 0 refills | Status: AC
  Filled 2024-09-17: qty 1, 30d supply, fill #0

## 2024-09-17 MED ORDER — GLUCOSE BLOOD VI STRP
ORAL_STRIP | 3 refills | Status: AC
  Filled 2024-09-17: qty 100, 90d supply, fill #0

## 2024-09-18 ENCOUNTER — Other Ambulatory Visit (HOSPITAL_COMMUNITY): Payer: Self-pay

## 2024-09-25 ENCOUNTER — Other Ambulatory Visit (HOSPITAL_COMMUNITY): Payer: Self-pay

## 2024-09-25 MED ORDER — FLUZONE 0.5 ML IM SUSY
0.5000 mL | PREFILLED_SYRINGE | Freq: Once | INTRAMUSCULAR | 0 refills | Status: AC
  Filled 2024-09-25: qty 0.5, 1d supply, fill #0

## 2024-09-30 ENCOUNTER — Other Ambulatory Visit: Payer: Self-pay

## 2024-10-06 ENCOUNTER — Other Ambulatory Visit: Payer: Self-pay

## 2024-10-06 ENCOUNTER — Other Ambulatory Visit (HOSPITAL_COMMUNITY): Payer: Self-pay

## 2024-12-04 ENCOUNTER — Other Ambulatory Visit (HOSPITAL_COMMUNITY): Payer: Self-pay
